# Patient Record
Sex: Male | Born: 1965 | Race: White | Hispanic: No | Marital: Married | Smoking: Never smoker
Health system: Southern US, Community
[De-identification: ages and names within clinical notes are randomized; demographics above are authoritative.]

## PROBLEM LIST (undated history)

## (undated) DIAGNOSIS — L409 Psoriasis, unspecified: Secondary | ICD-10-CM

## (undated) DIAGNOSIS — F329 Major depressive disorder, single episode, unspecified: Secondary | ICD-10-CM

## (undated) DIAGNOSIS — F419 Anxiety disorder, unspecified: Secondary | ICD-10-CM

## (undated) DIAGNOSIS — F32A Depression, unspecified: Secondary | ICD-10-CM

## (undated) HISTORY — PX: GASTRIC BYPASS: SHX52

## (undated) HISTORY — PX: KNEE SURGERY: SHX244

---
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
## 2005-05-10 ENCOUNTER — Emergency Department: Payer: Self-pay | Admitting: Emergency Medicine

## 2005-07-26 ENCOUNTER — Ambulatory Visit: Payer: Self-pay | Admitting: General Surgery

## 2006-07-05 ENCOUNTER — Emergency Department (HOSPITAL_COMMUNITY): Admission: EM | Admit: 2006-07-05 | Discharge: 2006-07-05 | Payer: Self-pay | Admitting: Emergency Medicine

## 2007-05-05 ENCOUNTER — Emergency Department: Payer: Self-pay | Admitting: Emergency Medicine

## 2007-05-06 ENCOUNTER — Other Ambulatory Visit: Payer: Self-pay

## 2008-08-09 ENCOUNTER — Emergency Department: Payer: Self-pay | Admitting: Emergency Medicine

## 2011-03-23 ENCOUNTER — Ambulatory Visit: Payer: Self-pay | Admitting: Dermatology

## 2011-04-13 ENCOUNTER — Ambulatory Visit: Payer: Self-pay | Admitting: Dermatology

## 2011-04-22 ENCOUNTER — Emergency Department: Payer: Self-pay | Admitting: Internal Medicine

## 2011-10-01 DIAGNOSIS — L409 Psoriasis, unspecified: Secondary | ICD-10-CM | POA: Insufficient documentation

## 2011-10-01 DIAGNOSIS — N401 Enlarged prostate with lower urinary tract symptoms: Secondary | ICD-10-CM | POA: Insufficient documentation

## 2014-06-07 DIAGNOSIS — E785 Hyperlipidemia, unspecified: Secondary | ICD-10-CM | POA: Insufficient documentation

## 2014-06-07 DIAGNOSIS — R079 Chest pain, unspecified: Secondary | ICD-10-CM | POA: Insufficient documentation

## 2014-06-07 DIAGNOSIS — R0602 Shortness of breath: Secondary | ICD-10-CM | POA: Insufficient documentation

## 2015-10-11 DIAGNOSIS — C4491 Basal cell carcinoma of skin, unspecified: Secondary | ICD-10-CM

## 2015-10-11 HISTORY — DX: Basal cell carcinoma of skin, unspecified: C44.91

## 2016-04-14 DIAGNOSIS — M6740 Ganglion, unspecified site: Secondary | ICD-10-CM | POA: Insufficient documentation

## 2016-04-14 DIAGNOSIS — M5416 Radiculopathy, lumbar region: Secondary | ICD-10-CM | POA: Insufficient documentation

## 2016-06-01 DIAGNOSIS — F40240 Claustrophobia: Secondary | ICD-10-CM | POA: Insufficient documentation

## 2016-06-14 DIAGNOSIS — E8881 Metabolic syndrome: Secondary | ICD-10-CM | POA: Insufficient documentation

## 2016-12-24 DIAGNOSIS — Z9884 Bariatric surgery status: Secondary | ICD-10-CM | POA: Insufficient documentation

## 2017-06-20 ENCOUNTER — Ambulatory Visit
Admission: EM | Admit: 2017-06-20 | Discharge: 2017-06-20 | Disposition: A | Discharge status: Home / Self Care | Payer: 59 | Attending: Family Medicine | Admitting: Family Medicine

## 2017-06-20 ENCOUNTER — Other Ambulatory Visit: Payer: Self-pay

## 2017-06-20 DIAGNOSIS — M542 Cervicalgia: Secondary | ICD-10-CM

## 2017-06-20 DIAGNOSIS — R51 Headache: Secondary | ICD-10-CM

## 2017-06-20 HISTORY — DX: Psoriasis, unspecified: L40.9

## 2017-06-20 MED ORDER — METAXALONE 800 MG PO TABS: 800.0000 mg | ORAL_TABLET | Freq: Three times a day (TID) | ORAL | 0 refills | Status: DC | PRN

## 2017-06-20 MED ORDER — KETOROLAC TROMETHAMINE 60 MG/2ML IM SOLN
60.0000 mg | Freq: Once | INTRAMUSCULAR | Status: AC
Start: 2017-06-20 — End: 2017-06-20
  Administered 2017-06-20: 60 mg via INTRAMUSCULAR

## 2017-06-20 NOTE — ED Provider Notes (Signed)
[facility]   CSN: [id number] Arrival date & time: 06/20/17  1101   History   Chief Complaint Chief Complaint  Patient presents with  . Neck Pain   HPI  52 year old male presents with neck pain.  Patient reports that he has had neck pain since Sunday.  He states that it is getting worse.  Pain is located predominantly in the posterior aspect of his neck.  He reports associated decreased range of motion in all planes but particularly in left rotation.  He has seen his chiropractor and had treatment without improvement.  He reports that he is having an ongoing severe headache as result of his neck pain.  He is currently wearing a neck brace.  He is tried ice and Biofreeze as well as his chiropractic treatments without improvement.  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  He is taken Tylenol without relief.  His pain is worse with range of motion.  No reports of radicular symptoms.  No other associated symptoms.  No other complaints.  Past Medical History:  Diagnosis Date  . Psoriasis    Past Surgical History:  Procedure Laterality Date  . GASTRIC BYPASS     Home Medications    Prior to Admission medications   Medication Sig Start Date End Date Taking? Authorizing Provider  Calcium Citrate 250 MG TABS Take by mouth.    [provider]  ferrous sulfate 325 (65 FE) MG tablet Take by mouth.    [provider]  metaxalone (SKELAXIN) 800 MG tablet Take 1 tablet (800 mg total) by mouth 3 (three) times daily as needed for muscle spasms. 06/20/17   Coral Spikes, DO  Multiple Vitamin (MULTI-VITAMINS) TABS Take by mouth.    [provider]  STELARA 90 MG/ML SOSY injection INJECT 1 SYRINGE UNDER THE SKIN EVERY 12 WEEKS 06/13/17   [provider]   Family History Family History  Problem Relation Age of Onset  . Cancer Mother   . Dementia Father    Social History Social History   Tobacco Use  . Smoking status: Never Smoker  . Smokeless  tobacco: Never Used  Substance Use Topics  . Alcohol use: Yes    Comment: rare  . Drug use: Never   Allergies   Patient has no known allergies.  Review of Systems Review of Systems  Constitutional: Negative.   Musculoskeletal: Positive for neck pain.  Neurological: Positive for headaches.    Physical Exam Triage Vital Signs ED Triage Vitals  Enc Vitals Group     BP 06/20/17 1113 116/81     Pulse Rate 06/20/17 1113 (!) 48     Resp 06/20/17 1113 18     Temp 06/20/17 1113 98.5 F (36.9 C)     Temp Source 06/20/17 1113 Oral     SpO2 06/20/17 1113 98 %     Weight 06/20/17 1114 218 lb (98.9 kg)     Height 06/20/17 1114 6\' 1"  (1.854 m)     Head Circumference --      Peak Flow --      Pain Score 06/20/17 1114 10     Pain Loc --      Pain Edu? --      Excl. in [place]? --    Updated Vital Signs BP 116/81 (BP Location: Right Arm)   Pulse (!) 48 Comment: "normally low heartrate"  Temp 98.5 F (36.9 C) (Oral)   Resp 18   Ht 6\' 1"  (  1.854 m)   Wt 218 lb (98.9 kg)   SpO2 98%   BMI 28.76 kg/m   Physical Exam  Constitutional: He is oriented to person, place, and time. He appears well-developed. No distress.  Neck:  Decreased range of motion in all planes secondary to pain. Tenderness posteriorly.  Cardiovascular: Normal rate and regular rhythm.  Pulmonary/Chest: Effort normal and breath sounds normal.  Neurological: He is alert and oriented to person, place, and time.  Psychiatric: He has a normal mood and affect. His behavior is normal.  Nursing note and vitals reviewed.  UC Treatments / Results  Labs (all labs ordered are listed, but only abnormal results are displayed) Labs Reviewed - No data to display  EKG None Radiology No results found.  Procedures Procedures (including critical care time)  Medications Ordered in UC Medications  ketorolac (TORADOL) injection 60 mg (60 mg Intramuscular Given 06/20/17 1138)     Initial Impression / Assessment and Plan / UC  Course  I have reviewed the triage vital signs and the nursing notes.  Pertinent labs & imaging results that were available during my care of the patient were reviewed by me and considered in my medical decision making (see chart for details).     52 year old male presents with neck pain.  This is musculoskeletal in origin.  Toradol given here.  Treating with Skelaxin.  Final Clinical Impressions(s) / UC Diagnoses   Final diagnoses:  Neck pain    ED Discharge Orders        Ordered    metaxalone (SKELAXIN) 800 MG tablet  3 times daily PRN     06/20/17 1138     Controlled Substance Prescriptions [place] Controlled Substance Registry consulted? Not Applicable   Coral Spikes, DO 06/20/17 1217

## 2017-06-20 NOTE — Discharge Instructions (Signed)
Heat, hot bath/shower.  Muscle relaxant as prescribed.  Take care  Dr. Lacinda Axon

## 2017-06-20 NOTE — ED Triage Notes (Addendum)
Pt reports he awoke from a nap in his chair on Sunday with neck pain which has gotten worse since yesterday. Seen by Chiropractor but no improvement. Pain 10/10 and causing a bad headache

## 2018-02-12 DIAGNOSIS — C4492 Squamous cell carcinoma of skin, unspecified: Secondary | ICD-10-CM

## 2018-02-12 HISTORY — DX: Squamous cell carcinoma of skin, unspecified: C44.92

## 2018-05-28 DIAGNOSIS — R001 Bradycardia, unspecified: Secondary | ICD-10-CM | POA: Insufficient documentation

## 2018-12-15 ENCOUNTER — Other Ambulatory Visit: Payer: Self-pay

## 2018-12-15 DIAGNOSIS — Z20822 Contact with and (suspected) exposure to covid-19: Secondary | ICD-10-CM

## 2018-12-17 LAB — NOVEL CORONAVIRUS, NAA: SARS-CoV-2, NAA: NOT DETECTED

## 2019-02-27 ENCOUNTER — Encounter: Payer: Self-pay | Admitting: Emergency Medicine

## 2019-02-27 ENCOUNTER — Other Ambulatory Visit: Payer: Self-pay

## 2019-02-27 ENCOUNTER — Ambulatory Visit
Admission: EM | Admit: 2019-02-27 | Discharge: 2019-02-27 | Disposition: A | Discharge status: Home / Self Care | Payer: Managed Care, Other (non HMO) | Attending: Family Medicine | Admitting: Family Medicine

## 2019-02-27 DIAGNOSIS — R197 Diarrhea, unspecified: Secondary | ICD-10-CM

## 2019-02-27 DIAGNOSIS — R509 Fever, unspecified: Secondary | ICD-10-CM

## 2019-02-27 DIAGNOSIS — B349 Viral infection, unspecified: Secondary | ICD-10-CM | POA: Diagnosis not present

## 2019-02-27 DIAGNOSIS — R05 Cough: Secondary | ICD-10-CM | POA: Diagnosis not present

## 2019-02-27 HISTORY — DX: Anxiety disorder, unspecified: F41.9

## 2019-02-27 HISTORY — DX: Depression, unspecified: F32.A

## 2019-02-27 LAB — RAPID INFLUENZA A&B ANTIGENS
Influenza A (ARMC): NEGATIVE
Influenza B (ARMC): NEGATIVE

## 2019-02-27 NOTE — ED Triage Notes (Signed)
Patient in today c/o cough, diarrhea, fever (99-101), body aches, dizzy, runny nose, headache and scratchy throat. Patient tested positive for Covid end of Aug or first of September. Patient then had a negative covid test 6 weeks later from his positive test.

## 2019-02-27 NOTE — ED Provider Notes (Signed)
[facility]    CSN: [id number] Arrival date & time: 02/27/19  [id number]      History   Chief Complaint Chief Complaint  Patient presents with  . Cough  . Diarrhea  . Fever    HPI AUBRA MCKEONE is a 53 y.o. male.   53 yo male with a c/o body aches, cough, fevers, headache, scratchy throat, diarrhea. Denies any chest pains or shortness of breath.    Cough Associated symptoms: fever   Diarrhea Associated symptoms: fever   Fever Associated symptoms: cough and diarrhea     Past Medical History:  Diagnosis Date  . Anxiety   . Depression   . Psoriasis     There are no problems to display for this patient.   Past Surgical History:  Procedure Laterality Date  . GASTRIC BYPASS         Home Medications    Prior to Admission medications   Medication Sig Start Date End Date Taking? Authorizing Provider  busPIRone (BUSPAR) 7.5 MG tablet Take by mouth. 02/13/19 02/13/20 Yes [provider]  citalopram (CELEXA) 20 MG tablet Take by mouth. 02/13/19  Yes [provider]  Multiple Vitamin (MULTI-VITAMINS) TABS Take by mouth.   Yes [provider]  pantoprazole (PROTONIX) 40 MG tablet Take by mouth. 02/13/19  Yes [provider]  STELARA 90 MG/ML SOSY injection INJECT 1 SYRINGE UNDER THE SKIN EVERY 12 WEEKS 06/13/17  Yes [provider]  Calcium Citrate 250 MG TABS Take by mouth.    [provider]  ferrous sulfate 325 (65 FE) MG tablet Take by mouth.    [provider]  metaxalone (SKELAXIN) 800 MG tablet Take 1 tablet (800 mg total) by mouth 3 (three) times daily as needed for muscle spasms. 06/20/17   Coral Spikes, DO    Family History Family History  Problem Relation Age of Onset  . Cancer Mother   . Dementia Father     Social History Social History   Tobacco Use  . Smoking status: Never Smoker  . Smokeless tobacco: Never Used  Substance Use Topics  . Alcohol use: Yes    Comment: rare  .  Drug use: Never     Allergies   Patient has no known allergies.   Review of Systems Review of Systems  Constitutional: Positive for fever.  Respiratory: Positive for cough.   Gastrointestinal: Positive for diarrhea.     Physical Exam Triage Vital Signs ED Triage Vitals  Enc Vitals Group     BP 02/27/19 0906 119/69     Pulse Rate 02/27/19 0906 63     Resp 02/27/19 0906 18     Temp 02/27/19 0906 99.2 F (37.3 C)     Temp Source 02/27/19 0906 Oral     SpO2 02/27/19 0906 100 %     Weight 02/27/19 0906 228 lb (103.4 kg)     Height 02/27/19 0906 6\' 1"  (1.854 m)     Head Circumference --      Peak Flow --      Pain Score 02/27/19 0905 6     Pain Loc --      Pain Edu? --      Excl. in [place]? --    No data found.  Updated Vital Signs BP 119/69 (BP Location: Left Arm)   Pulse 63   Temp 99.2 F (37.3 C) (Oral)   Resp 18   Ht 6\' 1"  (1.854 m)   Wt 103.4  kg   SpO2 100%   BMI 30.08 kg/m   Visual Acuity Right Eye Distance:   Left Eye Distance:   Bilateral Distance:    Right Eye Near:   Left Eye Near:    Bilateral Near:     Physical Exam Vitals and nursing note reviewed.  Constitutional:      General: He is not in acute distress.    Appearance: He is not toxic-appearing or diaphoretic.  Cardiovascular:     Rate and Rhythm: Normal rate.  Pulmonary:     Effort: Pulmonary effort is normal. No respiratory distress.     Breath sounds: Normal breath sounds. No stridor. No wheezing, rhonchi or rales.  Neurological:     Mental Status: He is alert.      UC Treatments / Results  Labs (all labs ordered are listed, but only abnormal results are displayed) Labs Reviewed  RAPID INFLUENZA A&B ANTIGENS ([facility] ONLY)  NOVEL CORONAVIRUS, NAA (HOSP ORDER, SEND-OUT TO REF LAB; TAT 18-24 HRS)    EKG   Radiology No results found.  Procedures Procedures (including critical care time)  Medications Ordered in UC Medications - No data to display  Initial Impression /  Assessment and Plan / UC Course  I have reviewed the triage vital signs and the nursing notes.  Pertinent labs & imaging results that were available during my care of the patient were reviewed by me and considered in my medical decision making (see chart for details).      Final Clinical Impressions(s) / UC Diagnoses   Final diagnoses:  Viral syndrome    ED Prescriptions    None     1. Lab results and diagnosis reviewed with patient 2.. Recommend supportive treatment with rest, fluids, otc medications prn 3. covid test done 4. Follow-up prn if symptoms worsen or don't improve  PDMP not reviewed this encounter.   Norval Gable, MD 02/27/19 [phone]

## 2019-03-01 ENCOUNTER — Telehealth: Payer: Self-pay | Admitting: Emergency Medicine

## 2019-03-01 NOTE — Telephone Encounter (Signed)
Patient called to get covid results. Advised covid is positive. Patient advised to quarantine for 10 days from start of symptoms and 3 days fever free without fever reducer and no new respiratory symptoms. Patient agreed and voiced understanding.

## 2019-03-02 LAB — NOVEL CORONAVIRUS, NAA (HOSP ORDER, SEND-OUT TO REF LAB; TAT 18-24 HRS): SARS-CoV-2, NAA: DETECTED — AB

## 2019-05-29 ENCOUNTER — Ambulatory Visit: Payer: Managed Care, Other (non HMO)

## 2019-06-25 ENCOUNTER — Other Ambulatory Visit: Payer: Self-pay

## 2019-06-25 ENCOUNTER — Ambulatory Visit: Payer: Self-pay | Attending: Internal Medicine

## 2019-06-25 DIAGNOSIS — Z23 Encounter for immunization: Secondary | ICD-10-CM

## 2019-06-25 NOTE — Progress Notes (Signed)
   Covid-19 Vaccination Clinic  Name:  Nicholas Cherry    MRN: [id number] DOB: 03-02-1966  06/25/2019  [name] was observed post Covid-19 immunization for 15 minutes without incident. He was provided with Vaccine Information Sheet and instruction to access the V-Safe system.   [name] was instructed to call 911 with any severe reactions post vaccine: Marland Kitchen Difficulty breathing  . Swelling of face and throat  . A fast heartbeat  . A bad rash all over body  . Dizziness and weakness   Immunizations Administered    Name Date Dose VIS Date Route   Pfizer COVID-19 Vaccine 06/25/2019  9:52 AM 0.3 mL 02/20/2019 Intramuscular   Manufacturer: [place]   Lot: [id number]   [place]: [id number]

## 2019-07-13 ENCOUNTER — Ambulatory Visit: Payer: Managed Care, Other (non HMO) | Admitting: Dermatology

## 2019-07-21 ENCOUNTER — Ambulatory Visit: Payer: Self-pay | Attending: Internal Medicine

## 2019-07-21 DIAGNOSIS — Z23 Encounter for immunization: Secondary | ICD-10-CM

## 2019-07-21 NOTE — Progress Notes (Signed)
   Covid-19 Vaccination Clinic  Name:  Nicholas Cherry    MRN: [id number] DOB: 03-22-1965  07/21/2019  [name] was observed post Covid-19 immunization for 15 minutes without incident. He was provided with Vaccine Information Sheet and instruction to access the V-Safe system.   [name] was instructed to call 911 with any severe reactions post vaccine: Marland Kitchen Difficulty breathing  . Swelling of face and throat  . A fast heartbeat  . A bad rash all over body  . Dizziness and weakness   Immunizations Administered    Name Date Dose VIS Date Route   Pfizer COVID-19 Vaccine 07/21/2019  9:21 AM 0.3 mL 05/06/2018 Intramuscular   Manufacturer: [place]   Lot: [id number]   [place]: [id number]

## 2019-09-11 ENCOUNTER — Ambulatory Visit (INDEPENDENT_AMBULATORY_CARE_PROVIDER_SITE_OTHER): Payer: PRIVATE HEALTH INSURANCE

## 2019-09-11 ENCOUNTER — Ambulatory Visit
Admission: EM | Admit: 2019-09-11 | Discharge: 2019-09-11 | Disposition: A | Discharge status: Home / Self Care | Payer: PRIVATE HEALTH INSURANCE | Attending: Family Medicine | Admitting: Family Medicine

## 2019-09-11 DIAGNOSIS — M79674 Pain in right toe(s): Secondary | ICD-10-CM | POA: Diagnosis not present

## 2019-09-11 DIAGNOSIS — S50319A Abrasion of unspecified elbow, initial encounter: Secondary | ICD-10-CM | POA: Diagnosis not present

## 2019-09-11 DIAGNOSIS — M25532 Pain in left wrist: Secondary | ICD-10-CM | POA: Diagnosis not present

## 2019-09-11 DIAGNOSIS — M25531 Pain in right wrist: Secondary | ICD-10-CM | POA: Diagnosis not present

## 2019-09-11 NOTE — Discharge Instructions (Addendum)
Take ibuprofen as needed.  Rest and elevate your hand.  Apply ice packs 2-3 times a day for up to 20 minutes each.  Wear a brace as needed for comfort  Xray was negative for any fractures or misalignments  Follow up with your primary care provider or an orthopedist if you symptoms continue or worsen;  Or if you develop new symptoms, such as numbness, tingling, or weakness.
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
## 2019-09-12 ENCOUNTER — Other Ambulatory Visit: Payer: Self-pay

## 2019-09-12 ENCOUNTER — Ambulatory Visit
Admit: 2019-09-12 | Discharge: 2019-09-12 | Disposition: A | Discharge status: Home / Self Care | Payer: PRIVATE HEALTH INSURANCE | Attending: Emergency Medicine | Admitting: Emergency Medicine

## 2019-09-12 ENCOUNTER — Ambulatory Visit
Admission: EM | Admit: 2019-09-12 | Discharge: 2019-09-12 | Disposition: A | Discharge status: Home / Self Care | Payer: PRIVATE HEALTH INSURANCE | Attending: Emergency Medicine | Admitting: Emergency Medicine

## 2019-09-12 DIAGNOSIS — N5089 Other specified disorders of the male genital organs: Secondary | ICD-10-CM

## 2019-09-12 DIAGNOSIS — S3994XA Unspecified injury of external genitals, initial encounter: Secondary | ICD-10-CM

## 2019-09-12 LAB — URINALYSIS, COMPLETE (UACMP) WITH MICROSCOPIC
Bilirubin Urine: NEGATIVE
Glucose, UA: NEGATIVE mg/dL
Hgb urine dipstick: NEGATIVE
Ketones, ur: NEGATIVE mg/dL
Leukocytes,Ua: NEGATIVE
Nitrite: NEGATIVE
Protein, ur: NEGATIVE mg/dL
RBC / HPF: NONE SEEN RBC/hpf (ref 0–5)
Specific Gravity, Urine: 1.025 (ref 1.005–1.030)
pH: 5.5 (ref 5.0–8.0)

## 2019-09-12 NOTE — Discharge Instructions (Addendum)
You were seen for testicle pain and are being treated for the same.   We completed a urine analysis which showed no blood in your urine.  A scrotal ultrasound was ordered.  It is to be completed at [facility]  We will give you a call with further directions afterwards. --- Take care, Dr. Marland Kitchen, NP-c
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
## 2020-01-25 ENCOUNTER — Other Ambulatory Visit: Payer: Self-pay

## 2020-01-25 ENCOUNTER — Encounter: Payer: Self-pay | Admitting: Dermatology

## 2020-01-25 ENCOUNTER — Ambulatory Visit: Payer: PRIVATE HEALTH INSURANCE | Admitting: Dermatology

## 2020-01-25 DIAGNOSIS — D492 Neoplasm of unspecified behavior of bone, soft tissue, and skin: Secondary | ICD-10-CM

## 2020-01-25 DIAGNOSIS — D2371 Other benign neoplasm of skin of right lower limb, including hip: Secondary | ICD-10-CM | POA: Diagnosis not present

## 2020-01-25 DIAGNOSIS — D2372 Other benign neoplasm of skin of left lower limb, including hip: Secondary | ICD-10-CM | POA: Diagnosis not present

## 2020-01-25 DIAGNOSIS — L820 Inflamed seborrheic keratosis: Secondary | ICD-10-CM

## 2020-01-25 DIAGNOSIS — C44320 Squamous cell carcinoma of skin of unspecified parts of face: Secondary | ICD-10-CM

## 2020-01-25 DIAGNOSIS — L578 Other skin changes due to chronic exposure to nonionizing radiation: Secondary | ICD-10-CM | POA: Diagnosis not present

## 2020-01-25 DIAGNOSIS — Z86007 Personal history of in-situ neoplasm of skin: Secondary | ICD-10-CM

## 2020-01-25 DIAGNOSIS — C44329 Squamous cell carcinoma of skin of other parts of face: Secondary | ICD-10-CM | POA: Diagnosis not present

## 2020-01-25 DIAGNOSIS — D239 Other benign neoplasm of skin, unspecified: Secondary | ICD-10-CM

## 2020-01-25 DIAGNOSIS — L409 Psoriasis, unspecified: Secondary | ICD-10-CM

## 2020-01-25 DIAGNOSIS — D225 Melanocytic nevi of trunk: Secondary | ICD-10-CM | POA: Diagnosis not present

## 2020-01-25 HISTORY — DX: Other benign neoplasm of skin, unspecified: D23.9
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
## 2020-01-25 NOTE — Patient Instructions (Signed)

## 2020-01-27 ENCOUNTER — Encounter: Payer: Self-pay | Admitting: Dermatology

## 2020-01-28 ENCOUNTER — Telehealth: Payer: Self-pay

## 2020-01-28 NOTE — Telephone Encounter (Signed)
Patient informed of pathology results 
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
## 2020-05-05 DIAGNOSIS — F1010 Alcohol abuse, uncomplicated: Secondary | ICD-10-CM | POA: Insufficient documentation

## 2020-05-05 DIAGNOSIS — F419 Anxiety disorder, unspecified: Secondary | ICD-10-CM | POA: Insufficient documentation

## 2020-05-09 ENCOUNTER — Telehealth: Payer: Self-pay

## 2020-05-09 DIAGNOSIS — L400 Psoriasis vulgaris: Secondary | ICD-10-CM

## 2020-05-09 NOTE — Telephone Encounter (Signed)
Patient advised of information per Dr. Kowalski.  

## 2020-05-09 NOTE — Telephone Encounter (Signed)
Patient called about a RF on his Stelara medication. Patient last here in November for 6 month follow up and at that time patient not using due to controlled psoriasis. Patient is currently experiencing a flare and would like a RF. Okay to send in?

## 2020-05-09 NOTE — Telephone Encounter (Signed)
We would need to get lab - please order: CBC Chemistries Quantiferon gold   Once those done, if they are OK, then can order Stelara - would need to start with loading dosing.  Keep May 2022 appt.

## 2020-05-18 LAB — BASIC METABOLIC PANEL
BUN/Creatinine Ratio: 15 (ref 9–20)
BUN: 12 mg/dL (ref 6–24)
CO2: 22 mmol/L (ref 20–29)
Calcium: 9.2 mg/dL (ref 8.7–10.2)
Chloride: 103 mmol/L (ref 96–106)
Creatinine, Ser: 0.82 mg/dL (ref 0.76–1.27)
Glucose: 95 mg/dL (ref 65–99)
Potassium: 4.5 mmol/L (ref 3.5–5.2)
Sodium: 140 mmol/L (ref 134–144)
eGFR: 103 mL/min/{1.73_m2} (ref >59)

## 2020-05-18 LAB — CBC WITH DIFFERENTIAL/PLATELET
Basophils Absolute: 0.1 10*3/uL (ref 0.0–0.2)
Basos: 1 %
EOS (ABSOLUTE): 0.2 10*3/uL (ref 0.0–0.4)
Eos: 4 %
Hematocrit: 41.8 % (ref 37.5–51.0)
Hemoglobin: 14.4 g/dL (ref 13.0–17.7)
Immature Grans (Abs): 0 10*3/uL (ref 0.0–0.1)
Immature Granulocytes: 1 %
Lymphocytes Absolute: 1.1 10*3/uL (ref 0.7–3.1)
Lymphs: 21 %
MCH: 29.6 pg (ref 26.6–33.0)
MCHC: 34.4 g/dL (ref 31.5–35.7)
MCV: 86 fL (ref 79–97)
Monocytes Absolute: 0.4 10*3/uL (ref 0.1–0.9)
Monocytes: 7 %
Neutrophils Absolute: 3.5 10*3/uL (ref 1.4–7.0)
Neutrophils: 66 %
Platelets: 208 10*3/uL (ref 150–450)
RBC: 4.87 x10E6/uL (ref 4.14–5.80)
RDW: 11.8 % (ref 11.6–15.4)
WBC: 5.2 10*3/uL (ref 3.4–10.8)

## 2020-05-18 LAB — QUANTIFERON-TB GOLD PLUS
QuantiFERON Mitogen Value: >10 IU/mL
QuantiFERON Nil Value: 0.01 IU/mL
QuantiFERON TB1 Ag Value: 0.01 IU/mL
QuantiFERON TB2 Ag Value: 0.01 IU/mL
QuantiFERON-TB Gold Plus: NEGATIVE

## 2020-05-23 ENCOUNTER — Telehealth: Payer: Self-pay

## 2020-05-23 DIAGNOSIS — L409 Psoriasis, unspecified: Secondary | ICD-10-CM

## 2020-05-23 MED ORDER — STELARA 90 MG/ML ~~LOC~~ SOSY: 90.0000 mg | PREFILLED_SYRINGE | SUBCUTANEOUS | 0 refills | Status: DC

## 2020-05-23 MED ORDER — STELARA 90 MG/ML ~~LOC~~ SOSY: 90.0000 mg | PREFILLED_SYRINGE | SUBCUTANEOUS | 1 refills | Status: DC

## 2020-05-23 NOTE — Telephone Encounter (Signed)
-----   Message from Ralene Bathe, MD sent at 05/18/2020 12:14 PM EST ----- Lab OK: CBC; Chemistry normal Quantiferon Gold / TB test - negative / normal  May send in [place] with starting/loading dosing. Keep 08/01/20 appt.

## 2020-05-23 NOTE — Telephone Encounter (Signed)
Advised patient of lab results and sent in Stelara to [facility].

## 2020-05-30 DIAGNOSIS — J984 Other disorders of lung: Secondary | ICD-10-CM | POA: Insufficient documentation

## 2020-07-11 ENCOUNTER — Other Ambulatory Visit: Payer: Self-pay

## 2020-07-11 DIAGNOSIS — L409 Psoriasis, unspecified: Secondary | ICD-10-CM

## 2020-07-11 MED ORDER — STELARA 90 MG/ML ~~LOC~~ SOSY: 90.0000 mg | PREFILLED_SYRINGE | SUBCUTANEOUS | 0 refills | Status: DC

## 2020-07-11 MED ORDER — STELARA 90 MG/ML ~~LOC~~ SOSY: 90.0000 mg | PREFILLED_SYRINGE | SUBCUTANEOUS | 1 refills | Status: DC

## 2020-07-11 NOTE — Progress Notes (Signed)
Per Iantha Fallen, rx needs to be sent to [facility]

## 2020-08-01 ENCOUNTER — Ambulatory Visit: Payer: PRIVATE HEALTH INSURANCE | Admitting: Dermatology

## 2020-11-21 ENCOUNTER — Other Ambulatory Visit: Payer: Self-pay

## 2020-11-21 ENCOUNTER — Ambulatory Visit: Payer: No Typology Code available for payment source | Admitting: Dermatology

## 2020-11-21 DIAGNOSIS — Z85828 Personal history of other malignant neoplasm of skin: Secondary | ICD-10-CM

## 2020-11-21 DIAGNOSIS — Z86018 Personal history of other benign neoplasm: Secondary | ICD-10-CM | POA: Diagnosis not present

## 2020-11-21 DIAGNOSIS — Z1283 Encounter for screening for malignant neoplasm of skin: Secondary | ICD-10-CM | POA: Diagnosis not present

## 2020-11-21 DIAGNOSIS — L814 Other melanin hyperpigmentation: Secondary | ICD-10-CM

## 2020-11-21 DIAGNOSIS — L821 Other seborrheic keratosis: Secondary | ICD-10-CM

## 2020-11-21 DIAGNOSIS — L820 Inflamed seborrheic keratosis: Secondary | ICD-10-CM

## 2020-11-21 DIAGNOSIS — D229 Melanocytic nevi, unspecified: Secondary | ICD-10-CM

## 2020-11-21 DIAGNOSIS — L578 Other skin changes due to chronic exposure to nonionizing radiation: Secondary | ICD-10-CM

## 2020-11-21 DIAGNOSIS — L409 Psoriasis, unspecified: Secondary | ICD-10-CM

## 2020-11-21 DIAGNOSIS — D1800 Hemangioma unspecified site: Secondary | ICD-10-CM

## 2020-11-21 NOTE — Progress Notes (Signed)
Follow-Up Visit   Subjective  Nicholas Cherry is a 55 y.o. male who presents for the following: Psoriasis (Patient here today for psoriasis follow up at hands. Patient currently on Stelara and psoriasis is well controlled. Patient does have a spot at neck that is itchy. ). The patient presents for Upper Body Skin Exam (UBSE) for skin cancer screening and mole check.  The following portions of the chart were reviewed this encounter and updated as appropriate:   Tobacco  Allergies  Meds  Problems  Med Hx  Surg Hx  Fam Hx     Review of Systems:  No other skin or systemic complaints except as noted in HPI or Assessment and Plan.  Objective  Well appearing patient in no apparent distress; mood and affect are within normal limits.  A focused examination was performed including legs, all skin waist up. Relevant physical exam findings are noted in the Assessment and Plan.  bilateral hands Clear   left lateral neck, right lateral neck (2) Erythematous keratotic or waxy stuck-on papule or plaque.    Assessment & Plan  Psoriasis bilateral hands  Psoriasis is a chronic non-curable, but treatable genetic/hereditary disease that may have other systemic features affecting other organ systems such as joints (Psoriatic Arthritis). It is associated with an increased risk of inflammatory bowel disease, heart disease, non-alcoholic fatty liver disease, and depression  Continue Stelara '90mg'$ /mL every 12 weeks BSA 0% on treatment, well controlled on Stelara No joint pain or arthritis Labs due at follow up Patient does have chest pain prior to eating, under evaluation and has had gallbladder removed. Not believed to be related to Stelara. Coincidentally symptoms began just after Covid vaccines.   Related Medications ustekinumab (STELARA) 90 MG/ML SOSY injection Inject 1 mL (90 mg total) into the skin as directed. At weeks 0 & 4.  Inflamed seborrheic keratosis left lateral neck, right  lateral neck  Destruction of lesion - left lateral neck, right lateral neck Complexity: simple   Destruction method: cryotherapy   Informed consent: discussed and consent obtained   Timeout:  patient name, date of birth, surgical site, and procedure verified Lesion destroyed using liquid nitrogen: Yes   Region frozen until ice ball extended beyond lesion: Yes   Outcome: patient tolerated procedure well with no complications   Post-procedure details: wound care instructions given    Skin cancer screening  History of Basal Cell Carcinoma of the Skin - No evidence of recurrence today - Recommend regular full body skin exams - Recommend daily broad spectrum sunscreen SPF 30+ to sun-exposed areas, reapply every 2 hours as needed.  - Call if any new or changing lesions are noted between office visits   History of Dysplastic Nevi - No evidence of recurrence today - Recommend regular full body skin exams - Recommend daily broad spectrum sunscreen SPF 30+ to sun-exposed areas, reapply every 2 hours as needed.  - Call if any new or changing lesions are noted between office visits  History of Squamous Cell Carcinoma of the Skin - No evidence of recurrence today - No lymphadenopathy - Recommend regular full body skin exams - Recommend daily broad spectrum sunscreen SPF 30+ to sun-exposed areas, reapply every 2 hours as needed.  - Call if any new or changing lesions are noted between office visits  Lentigines - Scattered tan macules - Due to sun exposure - Benign-appering, observe - Recommend daily broad spectrum sunscreen SPF 30+ to sun-exposed areas, reapply every 2 hours as needed. -  Call for any changes  Seborrheic Keratoses - Stuck-on, waxy, tan-brown papules and/or plaques  - Benign-appearing - Discussed benign etiology and prognosis. - Observe - Call for any changes  Melanocytic Nevi - Tan-brown and/or pink-flesh-colored symmetric macules and papules - Benign appearing on  exam today - Observation - Call clinic for new or changing moles - Recommend daily use of broad spectrum spf 30+ sunscreen to sun-exposed areas.   Hemangiomas - Red papules - Discussed benign nature - Observe - Call for any changes  Actinic Damage - Chronic condition, secondary to cumulative UV/sun exposure - diffuse scaly erythematous macules with underlying dyspigmentation - Recommend daily broad spectrum sunscreen SPF 30+ to sun-exposed areas, reapply every 2 hours as needed.  - Staying in the shade or wearing long sleeves, sun glasses (UVA+UVB protection) and wide brim hats (4-inch brim around the entire circumference of the hat) are also recommended for sun protection.  - Call for new or changing lesions.  Skin cancer screening performed today.  Return in about 6 months (around 05/21/2021) for Psoriasis.  Graciella Belton, RMA, am acting as scribe for Sarina Ser, MD . Documentation: I have reviewed the above documentation for accuracy and completeness, and I agree with the above.  Sarina Ser, MD

## 2020-11-21 NOTE — Patient Instructions (Signed)
Cryotherapy Aftercare  Wash gently with soap and water everyday.   Apply Vaseline and Band-Aid daily until healed.   Reviewed risks of biologics including immunosuppression, infections, injection site reaction, and failure to improve condition. Goal is control of skin condition, not cure.  Some older biologics such as Humira and Enbrel may slightly increase risk of malignancy and may worsen congestive heart failure. The use of biologics requires long term medication management, including periodic office visits and monitoring of blood work.  Recommend daily broad spectrum sunscreen SPF 30+ to sun-exposed areas, reapply every 2 hours as needed. Call for new or changing lesions.  Staying in the shade or wearing long sleeves, sun glasses (UVA+UVB protection) and wide brim hats (4-inch brim around the entire circumference of the hat) are also recommended for sun protection.   If you have any questions or concerns for your doctor, please call our main line at [phone] and press option 4 to reach your doctor's medical assistant. If no one answers, please leave a voicemail as directed and we will return your call as soon as possible. Messages left after 4 pm will be answered the following business day.   You may also send [place] a message via [place]. We typically respond to MyChart messages within 1-2 business days.  For prescription refills, please ask your pharmacy to contact our office. Our fax number is [phone].  If you have an urgent issue when the clinic is closed that cannot wait until the next business day, you can page your doctor at the number below.    Please note that while we do our best to be available for urgent issues outside of office hours, we are not available 24/7.   If you have an urgent issue and are unable to reach [place], you may choose to seek medical care at your doctor's office, retail clinic, urgent care center, or emergency room.  If you have a medical emergency, please  immediately call 911 or go to the emergency department.  Pager Numbers  - Dr. Nehemiah Massed: [phone]  - Dr. Laurence Ferrari: [phone]  - Dr. Nicole Kindred: [phone]  In the event of inclement weather, please call our main line at [phone] for an update on the status of any delays or closures.  Dermatology Medication Tips: Please keep the boxes that topical medications come in in order to help keep track of the instructions about where and how to use these. Pharmacies typically print the medication instructions only on the boxes and not directly on the medication tubes.   If your medication is too expensive, please contact our office at [phone] option 4 or send [place] a message through [place].   We are unable to tell what your co-pay for medications will be in advance as this is different depending on your insurance coverage. However, we may be able to find a substitute medication at lower cost or fill out paperwork to get insurance to cover a needed medication.   If a prior authorization is required to get your medication covered by your insurance company, please allow [place] 1-2 business days to complete this process.  Drug prices often vary depending on where the prescription is filled and some pharmacies may offer cheaper prices.  The website www.goodrx.com contains coupons for medications through different pharmacies. The prices here do not account for what the cost may be with help from insurance (it may be cheaper with your insurance), but the website can give you the price if you did not use any insurance.  -  You can print the associated coupon and take it with your prescription to the pharmacy.  - You may also stop by our office during regular business hours and pick up a GoodRx coupon card.  - If you need your prescription sent electronically to a different pharmacy, notify our office through [facility] or by phone at [phone] option 4.

## 2020-11-25 ENCOUNTER — Encounter: Payer: Self-pay | Admitting: Dermatology

## 2020-11-30 DIAGNOSIS — K219 Gastro-esophageal reflux disease without esophagitis: Secondary | ICD-10-CM | POA: Insufficient documentation

## 2020-11-30 DIAGNOSIS — K224 Dyskinesia of esophagus: Secondary | ICD-10-CM | POA: Insufficient documentation
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
## 2021-05-24 ENCOUNTER — Other Ambulatory Visit: Payer: Self-pay

## 2021-05-24 ENCOUNTER — Other Ambulatory Visit: Payer: Self-pay | Admitting: Dermatology

## 2021-05-24 ENCOUNTER — Ambulatory Visit (INDEPENDENT_AMBULATORY_CARE_PROVIDER_SITE_OTHER): Payer: PRIVATE HEALTH INSURANCE | Admitting: Dermatology

## 2021-05-24 DIAGNOSIS — D229 Melanocytic nevi, unspecified: Secondary | ICD-10-CM

## 2021-05-24 DIAGNOSIS — L814 Other melanin hyperpigmentation: Secondary | ICD-10-CM

## 2021-05-24 DIAGNOSIS — L400 Psoriasis vulgaris: Secondary | ICD-10-CM

## 2021-05-24 DIAGNOSIS — D1800 Hemangioma unspecified site: Secondary | ICD-10-CM

## 2021-05-24 DIAGNOSIS — L821 Other seborrheic keratosis: Secondary | ICD-10-CM

## 2021-05-24 DIAGNOSIS — L570 Actinic keratosis: Secondary | ICD-10-CM

## 2021-05-24 DIAGNOSIS — Z85828 Personal history of other malignant neoplasm of skin: Secondary | ICD-10-CM | POA: Diagnosis not present

## 2021-05-24 DIAGNOSIS — L578 Other skin changes due to chronic exposure to nonionizing radiation: Secondary | ICD-10-CM

## 2021-05-24 DIAGNOSIS — D225 Melanocytic nevi of trunk: Secondary | ICD-10-CM

## 2021-05-24 DIAGNOSIS — Z86018 Personal history of other benign neoplasm: Secondary | ICD-10-CM

## 2021-05-24 DIAGNOSIS — Z1283 Encounter for screening for malignant neoplasm of skin: Secondary | ICD-10-CM

## 2021-05-24 DIAGNOSIS — D485 Neoplasm of uncertain behavior of skin: Secondary | ICD-10-CM | POA: Diagnosis not present

## 2021-05-24 MED ORDER — MOMETASONE FUROATE 0.1 % EX CREA: TOPICAL_CREAM | CUTANEOUS | 3 refills | Status: DC
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
## 2021-05-24 NOTE — Patient Instructions (Addendum)

## 2021-05-29 ENCOUNTER — Encounter: Payer: Self-pay | Admitting: Dermatology

## 2021-05-30 ENCOUNTER — Telehealth: Payer: Self-pay

## 2021-05-30 NOTE — Telephone Encounter (Signed)
Advised patient of results and scheduled appointment for excision/hd ? ?

## 2021-05-30 NOTE — Telephone Encounter (Signed)
-----   Message from Ralene Bathe, MD sent at 05/29/2021  6:30 PM EDT ----- ?Diagnosis ?1. Skin , left lat infraclavicular ?ACTINIC KERATOSIS, EXCORIATED ?2. Skin , left infrascapular ?DYSPLASTIC COMPOUND NEVUS WITH SEVERE ATYPIA, PERIPHERAL MARGIN INVOLVED, SEE DESCRIPTION ? ?1- PreCancer ?Schedule for treatment (LN2) ?2- Severe dysplastic ?Schedule surgery ?

## 2021-07-11 ENCOUNTER — Ambulatory Visit (INDEPENDENT_AMBULATORY_CARE_PROVIDER_SITE_OTHER): Payer: PRIVATE HEALTH INSURANCE | Admitting: Dermatology

## 2021-07-11 ENCOUNTER — Encounter: Payer: Self-pay | Admitting: Dermatology

## 2021-07-11 DIAGNOSIS — D235 Other benign neoplasm of skin of trunk: Secondary | ICD-10-CM | POA: Diagnosis not present

## 2021-07-11 DIAGNOSIS — D239 Other benign neoplasm of skin, unspecified: Secondary | ICD-10-CM

## 2021-07-11 DIAGNOSIS — L570 Actinic keratosis: Secondary | ICD-10-CM

## 2021-07-11 MED ORDER — MUPIROCIN 2 % EX OINT: 1.0000 "application " | TOPICAL_OINTMENT | Freq: Every day | CUTANEOUS | 0 refills | Status: DC

## 2021-07-11 NOTE — Progress Notes (Signed)
? ?  Follow-Up Visit ?  ?Subjective  ?Nicholas Cherry is a 56 y.o. male who presents for the following: Severe Dysplastic nevus, bx proven (left infrascapular, pt presents for excision). ? ?The following portions of the chart were reviewed this encounter and updated as appropriate:  ? Tobacco  Allergies  Meds  Problems  Med Hx  Surg Hx  Fam Hx   ?  ?Review of Systems:  No other skin or systemic complaints except as noted in HPI or Assessment and Plan. ? ?Objective  ?Well appearing patient in no apparent distress; mood and affect are within normal limits. ? ?A focused examination was performed including back. Relevant physical exam findings are noted in the Assessment and Plan. ? ?left infrascapular ?Pink bx site ? ?Left lat infraclavicular ?Healing biopsy site ? ? ?Assessment & Plan  ?Dysplastic nevus - biopsy proven Severe ?left infrascapular ? ?Start Mupirocin oint qd to excision site ? ?Skin excision - left infrascapular ? ?Lesion length (cm):  1.2 ?Lesion width (cm):  1 ?Margin per side (cm):  0.2 ?Total excision diameter (cm):  1.6 ?Informed consent: discussed and consent obtained   ?Timeout: patient name, date of birth, surgical site, and procedure verified   ?Procedure prep:  Patient was prepped and draped in usual sterile fashion ?Prep type:  Isopropyl alcohol and povidone-iodine ?Anesthesia: the lesion was anesthetized in a standard fashion   ?Anesthetic:  1% lidocaine w/ epinephrine 1-100,000 buffered w/ 8.4% NaHCO3 ?Instrument used: #15 blade   ?Hemostasis achieved with: pressure   ?Hemostasis achieved with comment:  Electrocautery ?Outcome: patient tolerated procedure well with no complications   ?Post-procedure details: sterile dressing applied and wound care instructions given   ?Dressing type: bandage, pressure dressing and bacitracin (Mupirocin)   ? ?Skin repair - left infrascapular ?Complexity:  Complex ?Final length (cm):  4 ?Reason for type of repair: reduce tension to allow closure, reduce  the risk of dehiscence, infection, and necrosis, reduce subcutaneous dead space and avoid a hematoma, allow closure of the large defect, preserve normal anatomy, preserve normal anatomical and functional relationships and enhance both functionality and cosmetic results   ?Undermining: area extensively undermined   ?Undermining comment:  Undermining Defect 1.4 cm ?Subcutaneous layers (deep stitches):  ?Suture size:  2-0 ?Suture type: Vicryl (polyglactin 910)   ?Subcutaneous suture technique: Inverted Dermal. ?Fine/surface layer approximation (top stitches):  ?Suture size:  3-0 ?Stitches: simple running   ?Suture removal (days):  7 ?Hemostasis achieved with: pressure ?Outcome: patient tolerated procedure well with no complications   ?Post-procedure details: sterile dressing applied and wound care instructions given   ?Dressing type: bandage, pressure dressing and bacitracin (Mupirocin)   ? ?mupirocin ointment (BACTROBAN) 2 % - left infrascapular ?Apply 1 application. topically daily. With dressing changes ? ?Specimen 1 - Surgical pathology ?Differential Diagnosis: D48.5 Dysplastic nevus, bx proven ? ?Check Margins: yes ?Pink bx site ?[id number] ? ?AK (actinic keratosis) ?Left lat infraclavicular ?Biopsy proven ?Will plan LN2 at post op appointment ? ?Return in about 1 week (around 07/18/2021) for suture removal. ? ?I, Ashok Cordia, CMA, am acting as scribe for Sarina Ser, MD . ?Documentation: I have reviewed the above documentation for accuracy and completeness, and I agree with the above. ? ?Sarina Ser, MD ? ?

## 2021-07-11 NOTE — Patient Instructions (Signed)
Wound Care Instructions ? ?On the day following your surgery, you should begin doing daily dressing changes: ?Remove the old dressing and discard it. ?Cleanse the wound gently with tap water. This may be done in the shower or by placing a wet gauze pad directly on the wound and letting it soak for several minutes. ?It is important to gently remove any dried blood from the wound in order to encourage healing. This may be done by gently rolling a moistened Q-tip on the dried blood. Do not pick at the wound. ?If the wound should start to bleed, continue cleaning the wound, then place a moist gauze pad on the wound and hold pressure for a few minutes.  ?Make sure you then dry the skin surrounding the wound completely or the tape will not stick to the skin. Do not use cotton balls on the wound. ?After the wound is clean and dry, apply the ointment gently with a Q-tip. ?Cut a non-stick pad to fit the size of the wound. Lay the pad flush to the wound. If the wound is draining, you may want to reinforce it with a small amount of gauze on top of the non-stick pad for a little added compression to the area. ?Use the tape to seal the area completely. ?Select from the following with respect to your individual situation: ?If your wound has been stitched closed: continue the above steps 1-8 at least daily until your sutures are removed. ?If your wound has been left open to heal: continue steps 1-8 at least daily for the first 3-4 weeks. ?We would like for you to take a few extra precautions for at least the next week. ?Sleep with your head elevated on pillows if our wound is on your head. ?Do not bend over or lift heavy items to reduce the chance of elevated blood pressure to the wound ?Do not participate in particularly strenuous activities. ? ? ?Below is a list of dressing supplies you might need.  ?Cotton-tipped applicators - Q-tips ?Gauze pads (2x2 and/or 4x4) - All-Purpose Sponges ?Non-stick dressing material - Telfa ?Tape -  Paper or Hypafix ?New and clean tube of petroleum jelly - Vaseline  ? ? ?Comments on Post-Operative Period ?Slight swelling and redness often appear around the wound. This is normal and will disappear within several days following the surgery. ?The healing wound will drain a brownish-red-yellow discharge during healing. This is a normal phase of wound healing. As the wound begins to heal, the drainage may increase in amount. Again, this drainage is normal. ?Notify us if the drainage becomes persistently bloody, excessively swollen, or intensely painful or develops a foul odor or red streaks.  ?If you should experience mild discomfort during the healing phase, you may take an aspirin-free medication such as Tylenol (acetaminophen). Notify us if the discomfort is severe or persistent. Avoid alcoholic beverages when taking pain medicine. ? ?In Case of Wound Hemorrhage ?A wound hemorrhage is when the bandage suddenly becomes soaked with bright red blood and flows profusely. If this happens, sit down or lie down with your head elevated. If the wound has a dressing on it, do not remove the dressing. Apply pressure to the existing gauze. If the wound is not covered, use a gauze pad to apply pressure and continue applying the pressure for 20 minutes without peeking. DO NOT COVER THE WOUND WITH A LARGE TOWEL OR WASH CLOTH. Release your hand from the wound site but do not remove the dressing. If the bleeding has stopped,   gently clean around the wound. Leave the dressing in place for 24 hours if possible. This wait time allows the blood vessels to close off so that you do not spark a new round of bleeding by disrupting the newly clotted blood vessels with an immediate dressing change. If the bleeding does not subside, continue to hold pressure. If matters are out of your control, contact an After Hours clinic or go to the Emergency Room. ? ?If You Need Anything After Your Visit ? ?If you have any questions or concerns for your  doctor, please call our main line at [phone] and press option 4 to reach your doctor's medical assistant. If no one answers, please leave a voicemail as directed and we will return your call as soon as possible. Messages left after 4 pm will be answered the following business day.  ? ?You may also send us a message via MyChart. We typically respond to MyChart messages within 1-2 business days. ? ?For prescription refills, please ask your pharmacy to contact our office. Our fax number is [phone]. ? ?If you have an urgent issue when the clinic is closed that cannot wait until the next business day, you can page your doctor at the number below.   ? ?Please note that while we do our best to be available for urgent issues outside of office hours, we are not available 24/7.  ? ?If you have an urgent issue and are unable to reach us, you may choose to seek medical care at your doctor's office, retail clinic, urgent care center, or emergency room. ? ?If you have a medical emergency, please immediately call 911 or go to the emergency department. ? ?Pager Numbers ? ?- Dr. Kowalski: [phone] ? ?- Dr. Moye: [phone] ? ?- Dr. Stewart: [phone] ? ?In the event of inclement weather, please call our main line at [phone] for an update on the status of any delays or closures. ? ?Dermatology Medication Tips: ?Please keep the boxes that topical medications come in in order to help keep track of the instructions about where and how to use these. Pharmacies typically print the medication instructions only on the boxes and not directly on the medication tubes.  ? ?If your medication is too expensive, please contact our office at [phone] option 4 or send us a message through MyChart.  ? ?We are unable to tell what your co-pay for medications will be in advance as this is different depending on your insurance coverage. However, we may be able to find a substitute medication at lower cost or fill out paperwork  to get insurance to cover a needed medication.  ? ?If a prior authorization is required to get your medication covered by your insurance company, please allow us 1-2 business days to complete this process. ? ?Drug prices often vary depending on where the prescription is filled and some pharmacies may offer cheaper prices. ? ?The website www.goodrx.com contains coupons for medications through different pharmacies. The prices here do not account for what the cost may be with help from insurance (it may be cheaper with your insurance), but the website can give you the price if you did not use any insurance.  ?- You can print the associated coupon and take it with your prescription to the pharmacy.  ?- You may also stop by our office during regular business hours and pick up a GoodRx coupon card.  ?- If you need your prescription sent electronically to a different pharmacy, notify our office through   [facility] MyChart or by phone at [phone] option 4. ? ? ? ? ?Si Usted Necesita Algo Despu?s de Su Visita ? ?Tambi?n puede enviarnos un mensaje a trav?s de MyChart. Por lo general respondemos a los mensajes de MyChart en el transcurso de 1 a 2 d?as h?biles. ? ?Para renovar recetas, por favor pida a su farmacia que se ponga en contacto con nuestra oficina. Nuestro n?mero de fax es el [phone]. ? ?Si tiene un asunto urgente cuando la cl?nica est? cerrada y que no puede esperar hasta el siguiente d?a h?bil, puede llamar/localizar a su doctor(a) al n?mero que aparece a continuaci?n.  ? ?Por favor, tenga en cuenta que aunque hacemos todo lo posible para estar disponibles para asuntos urgentes fuera del horario de oficina, no estamos disponibles las 24 horas del d?a, los 7 d?as de la semana.  ? ?Si tiene un problema urgente y no puede comunicarse con nosotros, puede optar por buscar atenci?n m?dica  en el consultorio de su doctor(a), en una cl?nica privada, en un centro de atenci?n urgente o en una sala de  emergencias. ? ?Si tiene una emergencia m?dica, por favor llame inmediatamente al 911 o vaya a la sala de emergencias. ? ?N?meros de b?per ? ?- Dr. Kowalski: [phone] ? ?- Dra. Moye: [phone] ? ?- Dra. St

## 2021-07-17 ENCOUNTER — Ambulatory Visit (INDEPENDENT_AMBULATORY_CARE_PROVIDER_SITE_OTHER): Payer: 59 | Admitting: Dermatology

## 2021-07-17 DIAGNOSIS — Z4802 Encounter for removal of sutures: Secondary | ICD-10-CM

## 2021-07-17 DIAGNOSIS — D235 Other benign neoplasm of skin of trunk: Secondary | ICD-10-CM

## 2021-07-17 DIAGNOSIS — L570 Actinic keratosis: Secondary | ICD-10-CM

## 2021-07-17 DIAGNOSIS — D239 Other benign neoplasm of skin, unspecified: Secondary | ICD-10-CM

## 2021-07-17 NOTE — Patient Instructions (Signed)

## 2021-07-17 NOTE — Progress Notes (Signed)
   Follow-Up Visit   Subjective  Nicholas Cherry is a 56 y.o. male who presents for the following: Follow-up (Post op left infrascapular - Dysplastic nevus Margins free).  The following portions of the chart were reviewed this encounter and updated as appropriate:   Tobacco  Allergies  Meds  Problems  Med Hx  Surg Hx  Fam Hx     Review of Systems:  No other skin or systemic complaints except as noted in HPI or Assessment and Plan.  Objective  Well appearing patient in no apparent distress; mood and affect are within normal limits.  A focused examination was performed including back. Relevant physical exam findings are noted in the Assessment and Plan.  Left infrascapular Healing excision site  Left lat infraclavicular Healing biopsy site   Assessment & Plan  Dysplastic nevus Left infrascapular  Encounter for Removal of Sutures - Incision site at the left infrascapular is clean, dry and intact - Wound cleansed, sutures removed, wound cleansed and steri strips applied.  - Discussed pathology results showing dysplastic nevus margins free.  - Patient advised to keep steri-strips dry until they fall off. - Scars remodel for a full year. - Once steri-strips fall off, patient can apply over-the-counter silicone scar cream each night to help with scar remodeling if desired. - Patient advised to call with any concerns or if they notice any new or changing lesions.  AK (actinic keratosis) Left lat infraclavicular Biopsy-proven.  See path Will plan to treat with LN2 on follow up  Return for Follow up as scheduled.  I, Ashok Cordia, CMA, am acting as scribe for Sarina Ser, MD . Documentation: I have reviewed the above documentation for accuracy and completeness, and I agree with the above.  Sarina Ser, MD

## 2021-07-18 ENCOUNTER — Ambulatory Visit: Payer: 59 | Admitting: Dermatology

## 2021-07-19 ENCOUNTER — Encounter: Payer: Self-pay | Admitting: Dermatology

## 2021-08-01 ENCOUNTER — Encounter: Payer: Self-pay | Admitting: Dermatology

## 2021-08-24 ENCOUNTER — Ambulatory Visit (INDEPENDENT_AMBULATORY_CARE_PROVIDER_SITE_OTHER): Payer: PRIVATE HEALTH INSURANCE | Admitting: Dermatology

## 2021-08-24 DIAGNOSIS — L409 Psoriasis, unspecified: Secondary | ICD-10-CM | POA: Diagnosis not present

## 2021-08-24 DIAGNOSIS — T8130XA Disruption of wound, unspecified, initial encounter: Secondary | ICD-10-CM

## 2021-08-24 DIAGNOSIS — L820 Inflamed seborrheic keratosis: Secondary | ICD-10-CM

## 2021-08-24 DIAGNOSIS — L578 Other skin changes due to chronic exposure to nonionizing radiation: Secondary | ICD-10-CM | POA: Diagnosis not present

## 2021-08-24 DIAGNOSIS — L570 Actinic keratosis: Secondary | ICD-10-CM

## 2021-08-24 DIAGNOSIS — Z1283 Encounter for screening for malignant neoplasm of skin: Secondary | ICD-10-CM | POA: Diagnosis not present

## 2021-08-24 DIAGNOSIS — L814 Other melanin hyperpigmentation: Secondary | ICD-10-CM

## 2021-08-24 DIAGNOSIS — D229 Melanocytic nevi, unspecified: Secondary | ICD-10-CM

## 2021-08-24 DIAGNOSIS — L821 Other seborrheic keratosis: Secondary | ICD-10-CM

## 2021-08-24 NOTE — Progress Notes (Unsigned)
Follow-Up Visit   Subjective  Nicholas Cherry is a 56 y.o. male who presents for the following: Psoriasis (Patient has been off of Stelara for a few years now due to insurance changes. He uses Mometasone 0.1% cream PRN to aa's.) and Actinic Keratosis (L lat infra clavicular - bx proven, patient is here today for treatment).  The following portions of the chart were reviewed this encounter and updated as appropriate:   Tobacco  Allergies  Meds  Problems  Med Hx  Surg Hx  Fam Hx     Review of Systems:  No other skin or systemic complaints except as noted in HPI or Assessment and Plan.  Objective  Well appearing patient in no apparent distress; mood and affect are within normal limits.  All skin waist up examined.  L infra scapular x 1 Erythematous thin papules/macules with gritty scale.   L pretibial x 3 Erythematous stuck-on, waxy papule or plaque  B/L hands A few scaly patches on the hands  L infrascapular Spitting suture.   Assessment & Plan  AK (actinic keratosis) L infra scapular x 1 Bx proven on the L lat infra scapular   Destruction of lesion - L infra scapular x 1 Complexity: simple   Destruction method: cryotherapy   Informed consent: discussed and consent obtained   Timeout:  patient name, date of birth, surgical site, and procedure verified Lesion destroyed using liquid nitrogen: Yes   Region frozen until ice ball extended beyond lesion: Yes   Outcome: patient tolerated procedure well with no complications   Post-procedure details: wound care instructions given    Inflamed seborrheic keratosis L pretibial x 3 Symptomatic, irritating, patient would like treated. Destruction of lesion - L pretibial x 3 Complexity: simple   Destruction method: cryotherapy   Informed consent: discussed and consent obtained   Timeout:  patient name, date of birth, surgical site, and procedure verified Lesion destroyed using liquid nitrogen: Yes   Region frozen until  ice ball extended beyond lesion: Yes   Outcome: patient tolerated procedure well with no complications   Post-procedure details: wound care instructions given    Destruction of lesion - L pretibial x 3  Psoriasis B/L hands Chronic and persistent condition with duration or expected duration over one year. Condition is symptomatic / bothersome to patient. Not to goal. Psoriasis is a chronic non-curable, but treatable genetic/hereditary disease that may have other systemic features affecting other organ systems such as joints (Psoriatic Arthritis). It is associated with an increased risk of inflammatory bowel disease, heart disease, non-alcoholic fatty liver disease, and depression.    Continue Mometasone 0.1% cream to aa's QD up to 5 days per week.   Related Medications ustekinumab (STELARA) 90 MG/ML SOSY injection Inject 1 mL (90 mg total) into the skin as directed. At weeks 0 & 4.  Spitting suture, initial encounter L infrascapular Areas cleaned with Puracyn spray, Mupirocin 2% ointment applied to aa and covered with bandage.  Skin cancer screening  Actinic Damage - chronic, secondary to cumulative UV radiation exposure/sun exposure over time - diffuse scaly erythematous macules with underlying dyspigmentation - Recommend daily broad spectrum sunscreen SPF 30+ to sun-exposed areas, reapply every 2 hours as needed.  - Recommend staying in the shade or wearing long sleeves, sun glasses (UVA+UVB protection) and wide brim hats (4-inch brim around the entire circumference of the hat). - Call for new or changing lesions.  Seborrheic Keratoses - Stuck-on, waxy, tan-brown papules and/or plaques  - Benign-appearing -  Discussed benign etiology and prognosis. - Observe - Call for any changes  Lentigines - Scattered tan macules - Due to sun exposure - Benign-appering, observe - Recommend daily broad spectrum sunscreen SPF 30+ to sun-exposed areas, reapply every 2 hours as needed. - Call  for any changes  Melanocytic Nevi - Tan-brown and/or pink-flesh-colored symmetric macules and papules - Benign appearing on exam today - Observation - Call clinic for new or changing moles - Recommend daily use of broad spectrum spf 30+ sunscreen to sun-exposed areas.   Return in about 8 months (around 04/26/2022) for TBSE.  Luther Redo, CMA, am acting as scribe for Sarina Ser, MD . Documentation: I have reviewed the above documentation for accuracy and completeness, and I agree with the above.  Sarina Ser, MD

## 2021-08-24 NOTE — Patient Instructions (Signed)
Due to recent changes in healthcare laws, you may see results of your pathology and/or laboratory studies on MyChart before the doctors have had a chance to review them. We understand that in some cases there may be results that are confusing or concerning to you. Please understand that not all results are received at the same time and often the doctors may need to interpret multiple results in order to provide you with the best plan of care or course of treatment. Therefore, we ask that you please give us 2 business days to thoroughly review all your results before contacting the office for clarification. Should we see a critical lab result, you will be contacted sooner.   If You Need Anything After Your Visit  If you have any questions or concerns for your doctor, please call our main line at [phone] and press option 4 to reach your doctor's medical assistant. If no one answers, please leave a voicemail as directed and we will return your call as soon as possible. Messages left after 4 pm will be answered the following business day.   You may also send us a message via MyChart. We typically respond to MyChart messages within 1-2 business days.  For prescription refills, please ask your pharmacy to contact our office. Our fax number is [phone].  If you have an urgent issue when the clinic is closed that cannot wait until the next business day, you can page your doctor at the number below.    Please note that while we do our best to be available for urgent issues outside of office hours, we are not available 24/7.   If you have an urgent issue and are unable to reach us, you may choose to seek medical care at your doctor's office, retail clinic, urgent care center, or emergency room.  If you have a medical emergency, please immediately call 911 or go to the emergency department.  Pager Numbers  - Dr. Kowalski: [phone]  - Dr. Moye: [phone]  - Dr. Stewart:  [phone]  In the event of inclement weather, please call our main line at [phone] for an update on the status of any delays or closures.  Dermatology Medication Tips: Please keep the boxes that topical medications come in in order to help keep track of the instructions about where and how to use these. Pharmacies typically print the medication instructions only on the boxes and not directly on the medication tubes.   If your medication is too expensive, please contact our office at [phone] option 4 or send us a message through MyChart.   We are unable to tell what your co-pay for medications will be in advance as this is different depending on your insurance coverage. However, we may be able to find a substitute medication at lower cost or fill out paperwork to get insurance to cover a needed medication.   If a prior authorization is required to get your medication covered by your insurance company, please allow us 1-2 business days to complete this process.  Drug prices often vary depending on where the prescription is filled and some pharmacies may offer cheaper prices.  The website www.goodrx.com contains coupons for medications through different pharmacies. The prices here do not account for what the cost may be with help from insurance (it may be cheaper with your insurance), but the website can give you the price if you did not use any insurance.  - You can print the associated coupon and take it with   your prescription to the pharmacy.  - You may also stop by our office during regular business hours and pick up a GoodRx coupon card.  - If you need your prescription sent electronically to a different pharmacy, notify our office through [facility] MyChart or by phone at [phone] option 4.     Si Usted Necesita Algo Despus de Su Visita  Tambin puede enviarnos un mensaje a travs de MyChart. Por lo general respondemos a los mensajes de MyChart en el transcurso de 1 a 2  das hbiles.  Para renovar recetas, por favor pida a su farmacia que se ponga en contacto con nuestra oficina. Nuestro nmero de fax es el [phone].  Si tiene un asunto urgente cuando la clnica est cerrada y que no puede esperar hasta el siguiente da hbil, puede llamar/localizar a su doctor(a) al nmero que aparece a continuacin.   Por favor, tenga en cuenta que aunque hacemos todo lo posible para estar disponibles para asuntos urgentes fuera del horario de oficina, no estamos disponibles las 24 horas del da, los 7 das de la semana.   Si tiene un problema urgente y no puede comunicarse con nosotros, puede optar por buscar atencin mdica  en el consultorio de su doctor(a), en una clnica privada, en un centro de atencin urgente o en una sala de emergencias.  Si tiene una emergencia mdica, por favor llame inmediatamente al 911 o vaya a la sala de emergencias.  Nmeros de bper  - Dr. Kowalski: [phone]  - Dra. Moye: [phone]  - Dra. Stewart: [phone]  En caso de inclemencias del tiempo, por favor llame a nuestra lnea principal al [phone] para una actualizacin sobre el estado de cualquier retraso o cierre.  Consejos para la medicacin en dermatologa: Por favor, guarde las cajas en las que vienen los medicamentos de uso tpico para ayudarle a seguir las instrucciones sobre dnde y cmo usarlos. Las farmacias generalmente imprimen las instrucciones del medicamento slo en las cajas y no directamente en los tubos del medicamento.   Si su medicamento es muy caro, por favor, pngase en contacto con nuestra oficina llamando al [phone] y presione la opcin 4 o envenos un mensaje a travs de MyChart.   No podemos decirle cul ser su copago por los medicamentos por adelantado ya que esto es diferente dependiendo de la cobertura de su seguro. Sin embargo, es posible que podamos encontrar un medicamento sustituto a menor costo o llenar un formulario para que el  seguro cubra el medicamento que se considera necesario.   Si se requiere una autorizacin previa para que su compaa de seguros cubra su medicamento, por favor permtanos de 1 a 2 das hbiles para completar este proceso.  Los precios de los medicamentos varan con frecuencia dependiendo del lugar de dnde se surte la receta y alguna farmacias pueden ofrecer precios ms baratos.  El sitio web www.goodrx.com tiene cupones para medicamentos de diferentes farmacias. Los precios aqu no tienen en cuenta lo que podra costar con la ayuda del seguro (puede ser ms barato con su seguro), pero el sitio web puede darle el precio si no utiliz ningn seguro.  - Puede imprimir el cupn correspondiente y llevarlo con su receta a la farmacia.  - Tambin puede pasar por nuestra oficina durante el horario de atencin regular y recoger una tarjeta de cupones de GoodRx.  - Si necesita que su receta se enve electrnicamente a una farmacia diferente, informe a nuestra oficina a travs de [facility]   o por telfono llamando al [phone] y presione la opcin 4.  

## 2021-08-29 ENCOUNTER — Encounter: Payer: Self-pay | Admitting: Dermatology

## 2021-12-20 ENCOUNTER — Telehealth: Payer: Self-pay

## 2021-12-20 NOTE — Telephone Encounter (Signed)
Patient called wanting to start back on Stelara injections. Schedule appointment for additional psoriasis evaluation or start RX?  Pt last seen June 2023 and scheduled 6 month follow up for February 2024. aw

## 2021-12-21 NOTE — Telephone Encounter (Signed)
Patient advised and appointment scheduled. aw

## 2021-12-27 ENCOUNTER — Ambulatory Visit (INDEPENDENT_AMBULATORY_CARE_PROVIDER_SITE_OTHER): Payer: PRIVATE HEALTH INSURANCE | Admitting: Dermatology

## 2021-12-27 VITALS — Wt 227.0 lb

## 2021-12-27 DIAGNOSIS — Z85828 Personal history of other malignant neoplasm of skin: Secondary | ICD-10-CM

## 2021-12-27 DIAGNOSIS — L4050 Arthropathic psoriasis, unspecified: Secondary | ICD-10-CM | POA: Diagnosis not present

## 2021-12-27 DIAGNOSIS — Z8589 Personal history of malignant neoplasm of other organs and systems: Secondary | ICD-10-CM

## 2021-12-27 DIAGNOSIS — L578 Other skin changes due to chronic exposure to nonionizing radiation: Secondary | ICD-10-CM | POA: Diagnosis not present

## 2021-12-27 DIAGNOSIS — Z79899 Other long term (current) drug therapy: Secondary | ICD-10-CM

## 2021-12-27 DIAGNOSIS — L409 Psoriasis, unspecified: Secondary | ICD-10-CM

## 2021-12-27 NOTE — Patient Instructions (Signed)
Due to recent changes in healthcare laws, you may see results of your pathology and/or laboratory studies on MyChart before the doctors have had a chance to review them. We understand that in some cases there may be results that are confusing or concerning to you. Please understand that not all results are received at the same time and often the doctors may need to interpret multiple results in order to provide you with the best plan of care or course of treatment. Therefore, we ask that you please give us 2 business days to thoroughly review all your results before contacting the office for clarification. Should we see a critical lab result, you will be contacted sooner.   If You Need Anything After Your Visit  If you have any questions or concerns for your doctor, please call our main line at [phone] and press option 4 to reach your doctor's medical assistant. If no one answers, please leave a voicemail as directed and we will return your call as soon as possible. Messages left after 4 pm will be answered the following business day.   You may also send us a message via MyChart. We typically respond to MyChart messages within 1-2 business days.  For prescription refills, please ask your pharmacy to contact our office. Our fax number is [phone].  If you have an urgent issue when the clinic is closed that cannot wait until the next business day, you can page your doctor at the number below.    Please note that while we do our best to be available for urgent issues outside of office hours, we are not available 24/7.   If you have an urgent issue and are unable to reach us, you may choose to seek medical care at your doctor's office, retail clinic, urgent care center, or emergency room.  If you have a medical emergency, please immediately call 911 or go to the emergency department.  Pager Numbers  - Dr. Kowalski: [phone]  - Dr. Moye: [phone]  - Dr. Stewart:  [phone]  In the event of inclement weather, please call our main line at [phone] for an update on the status of any delays or closures.  Dermatology Medication Tips: Please keep the boxes that topical medications come in in order to help keep track of the instructions about where and how to use these. Pharmacies typically print the medication instructions only on the boxes and not directly on the medication tubes.   If your medication is too expensive, please contact our office at [phone] option 4 or send us a message through MyChart.   We are unable to tell what your co-pay for medications will be in advance as this is different depending on your insurance coverage. However, we may be able to find a substitute medication at lower cost or fill out paperwork to get insurance to cover a needed medication.   If a prior authorization is required to get your medication covered by your insurance company, please allow us 1-2 business days to complete this process.  Drug prices often vary depending on where the prescription is filled and some pharmacies may offer cheaper prices.  The website www.goodrx.com contains coupons for medications through different pharmacies. The prices here do not account for what the cost may be with help from insurance (it may be cheaper with your insurance), but the website can give you the price if you did not use any insurance.  - You can print the associated coupon and take it with   your prescription to the pharmacy.  - You may also stop by our office during regular business hours and pick up a GoodRx coupon card.  - If you need your prescription sent electronically to a different pharmacy, notify our office through [facility] MyChart or by phone at [phone] option 4.     Si Usted Necesita Algo Despus de Su Visita  Tambin puede enviarnos un mensaje a travs de MyChart. Por lo general respondemos a los mensajes de MyChart en el transcurso de 1 a 2  das hbiles.  Para renovar recetas, por favor pida a su farmacia que se ponga en contacto con nuestra oficina. Nuestro nmero de fax es el [phone].  Si tiene un asunto urgente cuando la clnica est cerrada y que no puede esperar hasta el siguiente da hbil, puede llamar/localizar a su doctor(a) al nmero que aparece a continuacin.   Por favor, tenga en cuenta que aunque hacemos todo lo posible para estar disponibles para asuntos urgentes fuera del horario de oficina, no estamos disponibles las 24 horas del da, los 7 das de la semana.   Si tiene un problema urgente y no puede comunicarse con nosotros, puede optar por buscar atencin mdica  en el consultorio de su doctor(a), en una clnica privada, en un centro de atencin urgente o en una sala de emergencias.  Si tiene una emergencia mdica, por favor llame inmediatamente al 911 o vaya a la sala de emergencias.  Nmeros de bper  - Dr. Kowalski: [phone]  - Dra. Moye: [phone]  - Dra. Stewart: [phone]  En caso de inclemencias del tiempo, por favor llame a nuestra lnea principal al [phone] para una actualizacin sobre el estado de cualquier retraso o cierre.  Consejos para la medicacin en dermatologa: Por favor, guarde las cajas en las que vienen los medicamentos de uso tpico para ayudarle a seguir las instrucciones sobre dnde y cmo usarlos. Las farmacias generalmente imprimen las instrucciones del medicamento slo en las cajas y no directamente en los tubos del medicamento.   Si su medicamento es muy caro, por favor, pngase en contacto con nuestra oficina llamando al [phone] y presione la opcin 4 o envenos un mensaje a travs de MyChart.   No podemos decirle cul ser su copago por los medicamentos por adelantado ya que esto es diferente dependiendo de la cobertura de su seguro. Sin embargo, es posible que podamos encontrar un medicamento sustituto a menor costo o llenar un formulario para que el  seguro cubra el medicamento que se considera necesario.   Si se requiere una autorizacin previa para que su compaa de seguros cubra su medicamento, por favor permtanos de 1 a 2 das hbiles para completar este proceso.  Los precios de los medicamentos varan con frecuencia dependiendo del lugar de dnde se surte la receta y alguna farmacias pueden ofrecer precios ms baratos.  El sitio web www.goodrx.com tiene cupones para medicamentos de diferentes farmacias. Los precios aqu no tienen en cuenta lo que podra costar con la ayuda del seguro (puede ser ms barato con su seguro), pero el sitio web puede darle el precio si no utiliz ningn seguro.  - Puede imprimir el cupn correspondiente y llevarlo con su receta a la farmacia.  - Tambin puede pasar por nuestra oficina durante el horario de atencin regular y recoger una tarjeta de cupones de GoodRx.  - Si necesita que su receta se enve electrnicamente a una farmacia diferente, informe a nuestra oficina a travs de MyChart de [place]   o por telfono llamando al [phone] y presione la opcin 4.  

## 2021-12-27 NOTE — Progress Notes (Signed)
   Follow-Up Visit   Subjective  Nicholas Cherry is a 56 y.o. male who presents for the following: Psoriasis (Patient c/o psoriasis flare on his arms,elbow, past treatment Stelara injections helped, patient would like restart Stelara injections ). Patient c/o joint pain of the knees.   The following portions of the chart were reviewed this encounter and updated as appropriate:   Tobacco  Allergies  Meds  Problems  Med Hx  Surg Hx  Fam Hx     Review of Systems:  No other skin or systemic complaints except as noted in HPI or Assessment and Plan.  Objective  Well appearing patient in no apparent distress; mood and affect are within normal limits.  A focused examination was performed including hands,arms,face . Relevant physical exam findings are noted in the Assessment and Plan.  palms, elbows Plaques on the arms, elbows            Assessment & Plan  Psoriasis With psoriatic arthritis-flared palms, elbows  Psoriasis with Psoriatic arthritis  Psoriasis is a chronic non-curable, but treatable genetic/hereditary disease that may have other systemic features affecting other organ systems such as joints (Psoriatic Arthritis). It is associated with an increased risk of inflammatory bowel disease, heart disease, non-alcoholic fatty liver disease, and depression.    Chronic and persistent condition with duration or expected duration over one year. Condition is bothersome/symptomatic for patient. Currently flared.  "current reported lab reviewed"  Pending normal labs  plan on Stelara injection 90 mg 2 injections  Labs ordered CMP, QuantiFeron-tb gold   Related Procedures QuantiFERON-TB Gold Plus CMP  Related Medications ustekinumab (STELARA) 90 MG/ML SOSY injection Inject 1 mL (90 mg total) into the skin as directed. At weeks 0 & 4.  History of Basal Cell Carcinoma of the Skin Left lateral infraclavicular  - No evidence of recurrence today - Recommend regular full  body skin exams - Recommend daily broad spectrum sunscreen SPF 30+ to sun-exposed areas, reapply every 2 hours as needed.  - Call if any new or changing lesions are noted between office visits   History of Squamous Cell Carcinoma of the Skin Multiple see history - No evidence of recurrence today - No lymphadenopathy - Recommend regular full body skin exams - Recommend daily broad spectrum sunscreen SPF 30+ to sun-exposed areas, reapply every 2 hours as needed.  - Call if any new or changing lesions are noted between office visits   Actinic Damage - chronic, secondary to cumulative UV radiation exposure/sun exposure over time - diffuse scaly erythematous macules with underlying dyspigmentation - Recommend daily broad spectrum sunscreen SPF 30+ to sun-exposed areas, reapply every 2 hours as needed.  - Recommend staying in the shade or wearing long sleeves, sun glasses (UVA+UVB protection) and wide brim hats (4-inch brim around the entire circumference of the hat). - Call for new or changing lesions.  Return in about 6 weeks (around 02/07/2022) for Psoriasis .  IMarye Round, CMA, am acting as scribe for Sarina Ser, MD .  Documentation: I have reviewed the above documentation for accuracy and completeness, and I agree with the above.  Sarina Ser, MD

## 2021-12-30 LAB — COMPREHENSIVE METABOLIC PANEL
ALT: 18 IU/L (ref 0–44)
AST: 21 IU/L (ref 0–40)
Albumin/Globulin Ratio: 2 (ref 1.2–2.2)
Albumin: 4.5 g/dL (ref 3.8–4.9)
Alkaline Phosphatase: 98 IU/L (ref 44–121)
BUN/Creatinine Ratio: 17 (ref 9–20)
BUN: 15 mg/dL (ref 6–24)
Bilirubin Total: 1.8 mg/dL — ABNORMAL HIGH (ref 0.0–1.2)
CO2: 26 mmol/L (ref 20–29)
Calcium: 9.2 mg/dL (ref 8.7–10.2)
Chloride: 99 mmol/L (ref 96–106)
Creatinine, Ser: 0.9 mg/dL (ref 0.76–1.27)
Globulin, Total: 2.2 g/dL (ref 1.5–4.5)
Glucose: 91 mg/dL (ref 70–99)
Potassium: 4.8 mmol/L (ref 3.5–5.2)
Sodium: 139 mmol/L (ref 134–144)
Total Protein: 6.7 g/dL (ref 6.0–8.5)
eGFR: 99 mL/min/{1.73_m2} (ref >59)

## 2021-12-30 LAB — QUANTIFERON-TB GOLD PLUS
QuantiFERON Mitogen Value: 2.67 IU/mL
QuantiFERON Nil Value: 0.03 IU/mL
QuantiFERON TB1 Ag Value: 0.03 IU/mL
QuantiFERON TB2 Ag Value: 0.04 IU/mL
QuantiFERON-TB Gold Plus: NEGATIVE

## 2022-01-03 ENCOUNTER — Telehealth: Payer: Self-pay

## 2022-01-03 MED ORDER — STELARA 90 MG/ML ~~LOC~~ SOSY
90.0000 mg | PREFILLED_SYRINGE | SUBCUTANEOUS | 0 refills | Status: DC
Start: 2022-01-03 — End: 2023-05-26

## 2022-01-03 MED ORDER — STELARA 90 MG/ML ~~LOC~~ SOSY: 90.0000 mg | PREFILLED_SYRINGE | SUBCUTANEOUS | 2 refills | Status: DC

## 2022-01-03 NOTE — Telephone Encounter (Signed)
-----   Message from Ralene Bathe, MD sent at 12/30/2021  5:10 PM EDT ----- Lab from 12/27/2021 showed: Chemistries including liver and kidney all OK. Quantiferon Gold / TB test negative / normal.  Call and advise pt and  Send Rx for Stelara Keep fu appts

## 2022-01-03 NOTE — Telephone Encounter (Signed)
Advised pt of labs, Stelara sent to [facility]

## 2022-01-06 ENCOUNTER — Encounter: Payer: Self-pay | Admitting: Dermatology

## 2022-01-24 ENCOUNTER — Telehealth: Payer: Self-pay

## 2022-01-24 NOTE — Telephone Encounter (Signed)
Patient advised and will be coming to pick these up. Patient agrees if the stelara process does not work, we can switch to [employer]. aw

## 2022-01-24 NOTE — Telephone Encounter (Signed)
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 His current insurance will only pay up to $50 on medications & he is maxing out his copay savings card within one fill. I am still working on this case and the field reimbursement rep is trying to get him over to patient assistance for being "under insured".  I was cleaning out our biologic refrigerator and we do have two samples of Stelara that will be expiring after next month.  If you wanted, we could get him his two loading doses in while we are working on this process?

## 2022-01-25 NOTE — Telephone Encounter (Signed)
Patient's wife given 2 Stelara '90mg'$  samples today.   Lot:LAS'00MG'$  Exp:02/2022

## 2022-02-14 ENCOUNTER — Ambulatory Visit: Payer: PRIVATE HEALTH INSURANCE | Admitting: Dermatology

## 2022-03-10 ENCOUNTER — Emergency Department: Payer: No Typology Code available for payment source

## 2022-03-10 ENCOUNTER — Emergency Department
Admission: EM | Admit: 2022-03-10 | Discharge: 2022-03-10 | Disposition: A | Discharge status: Home / Self Care | Payer: No Typology Code available for payment source | Attending: Emergency Medicine | Admitting: Emergency Medicine

## 2022-03-10 ENCOUNTER — Other Ambulatory Visit: Payer: Self-pay

## 2022-03-10 DIAGNOSIS — S0990XA Unspecified injury of head, initial encounter: Secondary | ICD-10-CM | POA: Diagnosis not present

## 2022-03-10 DIAGNOSIS — R911 Solitary pulmonary nodule: Secondary | ICD-10-CM | POA: Diagnosis not present

## 2022-03-10 DIAGNOSIS — R079 Chest pain, unspecified: Secondary | ICD-10-CM | POA: Insufficient documentation

## 2022-03-10 DIAGNOSIS — Y92410 Unspecified street and highway as the place of occurrence of the external cause: Secondary | ICD-10-CM | POA: Insufficient documentation

## 2022-03-10 DIAGNOSIS — M79604 Pain in right leg: Secondary | ICD-10-CM | POA: Diagnosis not present

## 2022-03-10 DIAGNOSIS — M79642 Pain in left hand: Secondary | ICD-10-CM | POA: Insufficient documentation

## 2022-03-10 DIAGNOSIS — Z23 Encounter for immunization: Secondary | ICD-10-CM | POA: Insufficient documentation

## 2022-03-10 DIAGNOSIS — S161XXA Strain of muscle, fascia and tendon at neck level, initial encounter: Secondary | ICD-10-CM | POA: Insufficient documentation

## 2022-03-10 DIAGNOSIS — S199XXA Unspecified injury of neck, initial encounter: Secondary | ICD-10-CM | POA: Diagnosis present

## 2022-03-10 DIAGNOSIS — T1490XA Injury, unspecified, initial encounter: Secondary | ICD-10-CM

## 2022-03-10 DIAGNOSIS — R109 Unspecified abdominal pain: Secondary | ICD-10-CM | POA: Diagnosis not present

## 2022-03-10 DIAGNOSIS — M545 Low back pain, unspecified: Secondary | ICD-10-CM | POA: Diagnosis not present

## 2022-03-10 LAB — CBC WITH DIFFERENTIAL/PLATELET
Abs Immature Granulocytes: 0.05 10*3/uL (ref 0.00–0.07)
Basophils Absolute: 0.1 10*3/uL (ref 0.0–0.1)
Basophils Relative: 1 %
Eosinophils Absolute: 0.2 10*3/uL (ref 0.0–0.5)
Eosinophils Relative: 2 %
HCT: 46.4 % (ref 39.0–52.0)
Hemoglobin: 14.9 g/dL (ref 13.0–17.0)
Immature Granulocytes: 1 %
Lymphocytes Relative: 18 %
Lymphs Abs: 1.2 10*3/uL (ref 0.7–4.0)
MCH: 28.8 pg (ref 26.0–34.0)
MCHC: 32.1 g/dL (ref 30.0–36.0)
MCV: 89.7 fL (ref 80.0–100.0)
Monocytes Absolute: 0.5 10*3/uL (ref 0.1–1.0)
Monocytes Relative: 7 %
Neutro Abs: 5 10*3/uL (ref 1.7–7.7)
Neutrophils Relative %: 71 %
Platelets: 215 10*3/uL (ref 150–400)
RBC: 5.17 MIL/uL (ref 4.22–5.81)
RDW: 12.2 % (ref 11.5–15.5)
WBC: 7 10*3/uL (ref 4.0–10.5)
nRBC: 0 % (ref 0.0–0.2)

## 2022-03-10 LAB — BASIC METABOLIC PANEL
Anion gap: 6 (ref 5–15)
BUN: 15 mg/dL (ref 6–20)
CO2: 23 mmol/L (ref 22–32)
Calcium: 9.5 mg/dL (ref 8.9–10.3)
Chloride: 110 mmol/L (ref 98–111)
Creatinine, Ser: 0.75 mg/dL (ref 0.61–1.24)
GFR, Estimated: >60 mL/min (ref >60)
Glucose, Bld: 62 mg/dL — ABNORMAL LOW (ref 70–99)
Potassium: 4.6 mmol/L (ref 3.5–5.1)
Sodium: 139 mmol/L (ref 135–145)

## 2022-03-10 LAB — TROPONIN I (HIGH SENSITIVITY): Troponin I (High Sensitivity): <2 ng/L (ref <18)

## 2022-03-10 MED ORDER — CYCLOBENZAPRINE HCL 10 MG PO TABS: 10.0000 mg | ORAL_TABLET | Freq: Three times a day (TID) | ORAL | 0 refills | Status: DC | PRN

## 2022-03-10 MED ORDER — KETOROLAC TROMETHAMINE 30 MG/ML IJ SOLN
30.0000 mg | Freq: Once | INTRAMUSCULAR | Status: AC
  Administered 2022-03-10: 30 mg via INTRAVENOUS
  Filled 2022-03-10: qty 1

## 2022-03-10 MED ORDER — TETANUS-DIPHTH-ACELL PERTUSSIS 5-2.5-18.5 LF-MCG/0.5 IM SUSY
0.5000 mL | PREFILLED_SYRINGE | Freq: Once | INTRAMUSCULAR | Status: AC
  Administered 2022-03-10: 0.5 mL via INTRAMUSCULAR
  Filled 2022-03-10: qty 0.5

## 2022-03-10 MED ORDER — IOHEXOL 300 MG/ML  SOLN
100.0000 mL | Freq: Once | INTRAMUSCULAR | Status: AC | PRN
  Administered 2022-03-10: 100 mL via INTRAVENOUS

## 2022-03-10 MED ORDER — OXYCODONE-ACETAMINOPHEN 5-325 MG PO TABS
1.0000 | ORAL_TABLET | Freq: Once | ORAL | Status: AC
  Administered 2022-03-10: 1 via ORAL
  Filled 2022-03-10: qty 1

## 2022-03-10 MED ORDER — OXYCODONE-ACETAMINOPHEN 5-325 MG PO TABS: 1.0000 | ORAL_TABLET | Freq: Four times a day (QID) | ORAL | 0 refills | Status: DC | PRN
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
## 2022-03-15 DIAGNOSIS — R911 Solitary pulmonary nodule: Secondary | ICD-10-CM | POA: Insufficient documentation

## 2022-03-15 DIAGNOSIS — I700 Atherosclerosis of aorta: Secondary | ICD-10-CM | POA: Insufficient documentation

## 2022-03-19 ENCOUNTER — Encounter: Payer: Self-pay | Admitting: Dermatology

## 2022-03-19 ENCOUNTER — Ambulatory Visit (INDEPENDENT_AMBULATORY_CARE_PROVIDER_SITE_OTHER): Payer: PRIVATE HEALTH INSURANCE | Admitting: Dermatology

## 2022-03-19 DIAGNOSIS — D485 Neoplasm of uncertain behavior of skin: Secondary | ICD-10-CM

## 2022-03-19 DIAGNOSIS — Z79899 Other long term (current) drug therapy: Secondary | ICD-10-CM | POA: Diagnosis not present

## 2022-03-19 DIAGNOSIS — L578 Other skin changes due to chronic exposure to nonionizing radiation: Secondary | ICD-10-CM

## 2022-03-19 DIAGNOSIS — D225 Melanocytic nevi of trunk: Secondary | ICD-10-CM

## 2022-03-19 DIAGNOSIS — L409 Psoriasis, unspecified: Secondary | ICD-10-CM

## 2022-03-19 DIAGNOSIS — Z8589 Personal history of malignant neoplasm of other organs and systems: Secondary | ICD-10-CM

## 2022-03-19 DIAGNOSIS — L4050 Arthropathic psoriasis, unspecified: Secondary | ICD-10-CM

## 2022-03-19 NOTE — Patient Instructions (Addendum)
Electrodesiccation and Curettage ("Scrape and Burn") Wound Care Instructions  Leave the original bandage on for 24 hours if possible.  If the bandage becomes soaked or soiled before that time, it is OK to remove it and examine the wound.  A small amount of post-operative bleeding is normal.  If excessive bleeding occurs, remove the bandage, place gauze over the site and apply continuous pressure (no peeking) over the area for 30 minutes. If this does not work, please call our clinic as soon as possible or page your doctor if it is after hours.   Once a day, cleanse the wound with soap and water. It is fine to shower. If a thick crust develops you may use a Q-tip dipped into dilute hydrogen peroxide (mix 1:1 with water) to dissolve it.  Hydrogen peroxide can slow the healing process, so use it only as needed.    After washing, apply petroleum jelly (Vaseline) or an antibiotic ointment if your doctor prescribed one for you, followed by a bandage.    For best healing, the wound should be covered with a layer of ointment at all times. If you are not able to keep the area covered with a bandage to hold the ointment in place, this may mean re-applying the ointment several times a day.  Continue this wound care until the wound has healed and is no longer open. It may take several weeks for the wound to heal and close.  Itching and mild discomfort is normal during the healing process.  If you have any discomfort, you can take Tylenol (acetaminophen) or ibuprofen as directed on the bottle. (Please do not take these if you have an allergy to them or cannot take them for another reason).  Some redness, tenderness and white or yellow material in the wound is normal healing.  If the area becomes very sore and red, or develops a thick yellow-green material (pus), it may be infected; please notify us.    Wound healing continues for up to one year following surgery. It is not unusual to experience pain in the scar  from time to time during the interval.  If the pain becomes severe or the scar thickens, you should notify the office.    A slight amount of redness in a scar is expected for the first six months.  After six months, the redness will fade and the scar will soften and fade.  The color difference becomes less noticeable with time.  If there are any problems, return for a post-op surgery check at your earliest convenience.  To improve the appearance of the scar, you can use silicone scar gel, cream, or sheets (such as Mederma or Serica) every night for up to one year. These are available over the counter (without a prescription).  Please call our office at [phone] for any questions or concerns.     Due to recent changes in healthcare laws, you may see results of your pathology and/or laboratory studies on MyChart before the doctors have had a chance to review them. We understand that in some cases there may be results that are confusing or concerning to you. Please understand that not all results are received at the same time and often the doctors may need to interpret multiple results in order to provide you with the best plan of care or course of treatment. Therefore, we ask that you please give us 2 business days to thoroughly review all your results before contacting the office for clarification. Should   we see a critical lab result, you will be contacted sooner.   If You Need Anything After Your Visit  If you have any questions or concerns for your doctor, please call our main line at [phone] and press option 4 to reach your doctor's medical assistant. If no one answers, please leave a voicemail as directed and we will return your call as soon as possible. Messages left after 4 pm will be answered the following business day.   You may also send us a message via MyChart. We typically respond to MyChart messages within 1-2 business days.  For prescription refills, please ask your  pharmacy to contact our office. Our fax number is [phone].  If you have an urgent issue when the clinic is closed that cannot wait until the next business day, you can page your doctor at the number below.    Please note that while we do our best to be available for urgent issues outside of office hours, we are not available 24/7.   If you have an urgent issue and are unable to reach us, you may choose to seek medical care at your doctor's office, retail clinic, urgent care center, or emergency room.  If you have a medical emergency, please immediately call 911 or go to the emergency department.  Pager Numbers  - Dr. Kowalski: [phone]  - Dr. Moye: [phone]  - Dr. Stewart: [phone]  In the event of inclement weather, please call our main line at [phone] for an update on the status of any delays or closures.  Dermatology Medication Tips: Please keep the boxes that topical medications come in in order to help keep track of the instructions about where and how to use these. Pharmacies typically print the medication instructions only on the boxes and not directly on the medication tubes.   If your medication is too expensive, please contact our office at [phone] option 4 or send us a message through MyChart.   We are unable to tell what your co-pay for medications will be in advance as this is different depending on your insurance coverage. However, we may be able to find a substitute medication at lower cost or fill out paperwork to get insurance to cover a needed medication.   If a prior authorization is required to get your medication covered by your insurance company, please allow us 1-2 business days to complete this process.  Drug prices often vary depending on where the prescription is filled and some pharmacies may offer cheaper prices.  The website www.goodrx.com contains coupons for medications through different pharmacies. The prices here do not  account for what the cost may be with help from insurance (it may be cheaper with your insurance), but the website can give you the price if you did not use any insurance.  - You can print the associated coupon and take it with your prescription to the pharmacy.  - You may also stop by our office during regular business hours and pick up a GoodRx coupon card.  - If you need your prescription sent electronically to a different pharmacy, notify our office through [facility] MyChart or by phone at [phone] option 4.     Si Usted Necesita Algo Despus de Su Visita  Tambin puede enviarnos un mensaje a travs de MyChart. Por lo general respondemos a los mensajes de MyChart en el transcurso de 1 a 2 das hbiles.  Para renovar recetas, por favor pida a su farmacia que se ponga en   contacto con nuestra oficina. Nuestro nmero de fax es el [phone].  Si tiene un asunto urgente cuando la clnica est cerrada y que no puede esperar hasta el siguiente da hbil, puede llamar/localizar a su doctor(a) al nmero que aparece a continuacin.   Por favor, tenga en cuenta que aunque hacemos todo lo posible para estar disponibles para asuntos urgentes fuera del horario de oficina, no estamos disponibles las 24 horas del da, los 7 das de la semana.   Si tiene un problema urgente y no puede comunicarse con nosotros, puede optar por buscar atencin mdica  en el consultorio de su doctor(a), en una clnica privada, en un centro de atencin urgente o en una sala de emergencias.  Si tiene una emergencia mdica, por favor llame inmediatamente al 911 o vaya a la sala de emergencias.  Nmeros de bper  - Dr. Kowalski: [phone]  - Dra. Moye: [phone]  - Dra. Stewart: [phone]  En caso de inclemencias del tiempo, por favor llame a nuestra lnea principal al [phone] para una actualizacin sobre el estado de cualquier retraso o cierre.  Consejos para la medicacin en dermatologa: Por  favor, guarde las cajas en las que vienen los medicamentos de uso tpico para ayudarle a seguir las instrucciones sobre dnde y cmo usarlos. Las farmacias generalmente imprimen las instrucciones del medicamento slo en las cajas y no directamente en los tubos del medicamento.   Si su medicamento es muy caro, por favor, pngase en contacto con nuestra oficina llamando al [phone] y presione la opcin 4 o envenos un mensaje a travs de MyChart.   No podemos decirle cul ser su copago por los medicamentos por adelantado ya que esto es diferente dependiendo de la cobertura de su seguro. Sin embargo, es posible que podamos encontrar un medicamento sustituto a menor costo o llenar un formulario para que el seguro cubra el medicamento que se considera necesario.   Si se requiere una autorizacin previa para que su compaa de seguros cubra su medicamento, por favor permtanos de 1 a 2 das hbiles para completar este proceso.  Los precios de los medicamentos varan con frecuencia dependiendo del lugar de dnde se surte la receta y alguna farmacias pueden ofrecer precios ms baratos.  El sitio web www.goodrx.com tiene cupones para medicamentos de diferentes farmacias. Los precios aqu no tienen en cuenta lo que podra costar con la ayuda del seguro (puede ser ms barato con su seguro), pero el sitio web puede darle el precio si no utiliz ningn seguro.  - Puede imprimir el cupn correspondiente y llevarlo con su receta a la farmacia.  - Tambin puede pasar por nuestra oficina durante el horario de atencin regular y recoger una tarjeta de cupones de GoodRx.  - Si necesita que su receta se enve electrnicamente a una farmacia diferente, informe a nuestra oficina a travs de MyChart de [facility] o por telfono llamando al [phone] y presione la opcin 4.  
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
## 2022-03-22 ENCOUNTER — Telehealth: Payer: Self-pay

## 2022-03-22 NOTE — Telephone Encounter (Signed)
Advised pt of bx result/sh ?

## 2022-03-22 NOTE — Telephone Encounter (Signed)
-----   Message from Ralene Bathe, MD sent at 03/21/2022  6:12 PM EST ----- Diagnosis Skin , right upper back paraspinal MELANOCYTIC NEVUS, TRAUMATIZED  Benign traumatized mole No further treatment needed

## 2022-04-26 ENCOUNTER — Ambulatory Visit: Payer: 59 | Admitting: Dermatology

## 2022-06-20 ENCOUNTER — Ambulatory Visit: Payer: PRIVATE HEALTH INSURANCE | Admitting: Dermatology

## 2022-07-09 ENCOUNTER — Ambulatory Visit
Admission: EM | Admit: 2022-07-09 | Discharge: 2022-07-09 | Payer: PRIVATE HEALTH INSURANCE | Attending: Family Medicine | Admitting: Family Medicine

## 2022-07-09 DIAGNOSIS — R112 Nausea with vomiting, unspecified: Secondary | ICD-10-CM

## 2022-07-09 DIAGNOSIS — S0990XA Unspecified injury of head, initial encounter: Secondary | ICD-10-CM | POA: Diagnosis not present

## 2022-07-09 DIAGNOSIS — R42 Dizziness and giddiness: Secondary | ICD-10-CM | POA: Diagnosis not present

## 2022-07-09 NOTE — Discharge Instructions (Addendum)
Due to your headache, vomiting, disorientation with memory loss, you will need to be evaluated in the emergency department.    You have been advised to follow up immediately in the emergency department for concerning signs or symptoms as discussed during your visit. If you declined EMS transport, please have a family member take you directly to the ED at this time. Do not delay.   Based on concerns about condition, if you do not follow up in the ED, you may risk poor outcomes including worsening of condition, delayed treatment and potentially life threatening issues. If you have declined to go to the ED at this time, you should call your PCP immediately to set up a follow up appointment.   Go to ED for red flag symptoms, including; fevers you cannot reduce with Tylenol/Motrin, severe headaches, vision changes, numbness/weakness in part of the body, lethargy, confusion, intractable vomiting, severe dehydration, chest pain, breathing difficulty, severe persistent abdominal or pelvic pain, signs of severe infection (increased redness, swelling of an area), feeling faint or passing out, dizziness, etc. You should especially go to the ED for sudden acute worsening of condition if you do not elect to go at this time.
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
## 2022-07-13 ENCOUNTER — Other Ambulatory Visit: Payer: Self-pay | Admitting: Family Medicine

## 2022-07-13 ENCOUNTER — Ambulatory Visit
Admission: RE | Admit: 2022-07-13 | Discharge: 2022-07-13 | Disposition: A | Discharge status: Home / Self Care | Payer: No Typology Code available for payment source | Source: Ambulatory Visit | Attending: Family Medicine | Admitting: Family Medicine

## 2022-07-13 DIAGNOSIS — S060X1A Concussion with loss of consciousness of 30 minutes or less, initial encounter: Secondary | ICD-10-CM | POA: Diagnosis present

## 2022-07-13 DIAGNOSIS — H532 Diplopia: Secondary | ICD-10-CM | POA: Insufficient documentation

## 2022-08-01 DIAGNOSIS — F329 Major depressive disorder, single episode, unspecified: Secondary | ICD-10-CM | POA: Insufficient documentation

## 2022-10-10 ENCOUNTER — Ambulatory Visit (INDEPENDENT_AMBULATORY_CARE_PROVIDER_SITE_OTHER): Payer: PRIVATE HEALTH INSURANCE | Admitting: Dermatology

## 2022-10-10 ENCOUNTER — Encounter: Payer: Self-pay | Admitting: Dermatology

## 2022-10-10 VITALS — BP 139/80 | HR 72

## 2022-10-10 DIAGNOSIS — Z8589 Personal history of malignant neoplasm of other organs and systems: Secondary | ICD-10-CM

## 2022-10-10 DIAGNOSIS — W908XXA Exposure to other nonionizing radiation, initial encounter: Secondary | ICD-10-CM

## 2022-10-10 DIAGNOSIS — Z1283 Encounter for screening for malignant neoplasm of skin: Secondary | ICD-10-CM

## 2022-10-10 DIAGNOSIS — L821 Other seborrheic keratosis: Secondary | ICD-10-CM

## 2022-10-10 DIAGNOSIS — L570 Actinic keratosis: Secondary | ICD-10-CM | POA: Diagnosis not present

## 2022-10-10 DIAGNOSIS — L409 Psoriasis, unspecified: Secondary | ICD-10-CM

## 2022-10-10 DIAGNOSIS — L578 Other skin changes due to chronic exposure to nonionizing radiation: Secondary | ICD-10-CM | POA: Diagnosis not present

## 2022-10-10 DIAGNOSIS — Z79899 Other long term (current) drug therapy: Secondary | ICD-10-CM

## 2022-10-10 DIAGNOSIS — Z85828 Personal history of other malignant neoplasm of skin: Secondary | ICD-10-CM

## 2022-10-10 DIAGNOSIS — Z86018 Personal history of other benign neoplasm: Secondary | ICD-10-CM

## 2022-10-10 DIAGNOSIS — L4050 Arthropathic psoriasis, unspecified: Secondary | ICD-10-CM

## 2022-10-10 DIAGNOSIS — Z7189 Other specified counseling: Secondary | ICD-10-CM

## 2022-10-10 DIAGNOSIS — L814 Other melanin hyperpigmentation: Secondary | ICD-10-CM

## 2022-10-10 MED ORDER — SKYRIZI PEN 150 MG/ML ~~LOC~~ SOAJ: 150.0000 mg | SUBCUTANEOUS | 1 refills | Status: DC

## 2022-10-10 MED ORDER — SKYRIZI PEN 150 MG/ML ~~LOC~~ SOAJ: 150.0000 mg | SUBCUTANEOUS | 4 refills | Status: DC

## 2022-10-10 NOTE — Patient Instructions (Signed)
Cryotherapy Aftercare  Wash gently with soap and water everyday.   Apply Vaseline Jelly daily until healed.    Reviewed risks of biologics including immunosuppression, infections, injection site reaction, and failure to improve condition. Goal is control of skin condition, not cure.  Some older biologics such as Humira and Enbrel may slightly increase risk of malignancy and may worsen congestive heart failure.  Taltz and Cosentyx may cause inflammatory bowel disease to flare. The use of biologics requires long term medication management, including periodic office visits and monitoring of blood work.   Due to recent changes in healthcare laws, you may see results of your pathology and/or laboratory studies on MyChart before the doctors have had a chance to review them. We understand that in some cases there may be results that are confusing or concerning to you. Please understand that not all results are received at the same time and often the doctors may need to interpret multiple results in order to provide you with the best plan of care or course of treatment. Therefore, we ask that you please give [place] 2 business days to thoroughly review all your results before contacting the office for clarification. Should we see a critical lab result, you will be contacted sooner.   If You Need Anything After Your Visit  If you have any questions or concerns for your doctor, please call our main line at [phone] and press option 4 to reach your doctor's medical assistant. If no one answers, please leave a voicemail as directed and we will return your call as soon as possible. Messages left after 4 pm will be answered the following business day.   You may also send [place] a message via MyChart. We typically respond to MyChart messages within 1-2 business days.  For prescription refills, please ask your pharmacy to contact our office. Our fax number is [phone].  If you have an urgent issue when the clinic  is closed that cannot wait until the next business day, you can page your doctor at the number below.    Please note that while we do our best to be available for urgent issues outside of office hours, we are not available 24/7.   If you have an urgent issue and are unable to reach [place], you may choose to seek medical care at your doctor's office, retail clinic, urgent care center, or emergency room.  If you have a medical emergency, please immediately call 911 or go to the emergency department.  Pager Numbers  - Dr. Gwen Pounds: [phone]  - Dr. Roseanne Reno: [phone]  In the event of inclement weather, please call our main line at [phone] for an update on the status of any delays or closures.  Dermatology Medication Tips: Please keep the boxes that topical medications come in in order to help keep track of the instructions about where and how to use these. Pharmacies typically print the medication instructions only on the boxes and not directly on the medication tubes.   If your medication is too expensive, please contact our office at [phone] option 4 or send [place] a message through MyChart.   We are unable to tell what your co-pay for medications will be in advance as this is different depending on your insurance coverage. However, we may be able to find a substitute medication at lower cost or fill out paperwork to get insurance to cover a needed medication.   If a prior authorization is required to get your medication covered by your insurance  company, please allow [place] 1-2 business days to complete this process.  Drug prices often vary depending on where the prescription is filled and some pharmacies may offer cheaper prices.  The website www.goodrx.com contains coupons for medications through different pharmacies. The prices here do not account for what the cost may be with help from insurance (it may be cheaper with your insurance), but the website can give you the price if you  did not use any insurance.  - You can print the associated coupon and take it with your prescription to the pharmacy.  - You may also stop by our office during regular business hours and pick up a GoodRx coupon card.  - If you need your prescription sent electronically to a different pharmacy, notify our office through [facility] or by phone at [phone] option 4.     Si Usted Necesita Algo Despus de Su Visita  Tambin puede enviarnos un mensaje a travs de Clinical cytogeneticist. Por lo general respondemos a los mensajes de MyChart en el transcurso de 1 a 2 das hbiles.  Para renovar recetas, por favor pida a su farmacia que se ponga en contacto con nuestra oficina. Annie Sable de fax es [place] [phone].  Si tiene un asunto urgente cuando la clnica est cerrada y que no puede esperar hasta el siguiente da hbil, puede llamar/localizar a su doctor(a) al nmero que aparece a continuacin.   Por favor, tenga en cuenta que aunque hacemos todo lo posible para estar disponibles para asuntos urgentes fuera del horario de [place], no estamos disponibles las 24 horas del da, los 7 [address]  [address] de la [place].   Si tiene un problema urgente y no puede comunicarse con nosotros, puede optar por buscar atencin mdica  en el consultorio de su doctor(a), en una clnica privada, en un centro de atencin urgente o en una sala de emergencias.  Si tiene Engineer, drilling, por favor llame inmediatamente al 911 o vaya a la sala de emergencias.  Nmeros de bper  - Dr. Gwen Pounds: [phone]  - Dra. Moye: [phone]  - Dra. Roseanne Reno: [phone]  En caso de inclemencias del [place], por favor llame a Lacy Duverney principal al [phone] para una actualizacin sobre el [place] de cualquier retraso o cierre.  Consejos para la medicacin en dermatologa: Por favor, guarde las cajas en las que vienen los medicamentos de uso tpico para ayudarle a seguir las instrucciones sobre dnde y cmo usarlos. Las  farmacias generalmente imprimen las instrucciones del medicamento slo en las cajas y no directamente en los tubos del [place].   Si su medicamento es muy caro, por favor, pngase en contacto con Rolm Gala llamando al [phone] y presione la opcin 4 o envenos un mensaje a travs de Clinical cytogeneticist.   No podemos decirle cul ser su copago por los medicamentos por adelantado ya que esto es diferente dependiendo de la cobertura de su seguro. Sin embargo, es posible que podamos encontrar un medicamento sustituto a Audiological scientist un formulario para que el seguro cubra el medicamento que se considera necesario.   Si se requiere una autorizacin previa para que su compaa de seguros [place] su medicamento, por favor permtanos de 1 a 2 das hbiles para completar [address]  Los precios de los medicamentos varan con frecuencia dependiendo del Environmental consultant de dnde se surte la receta y alguna farmacias pueden ofrecer precios ms baratos.  El sitio web www.goodrx.com tiene cupones para medicamentos de Health and safety inspector. Los precios aqu no tienen en cuenta  lo que podra costar con la ayuda del seguro (puede ser ms barato con su seguro), pero el sitio web puede darle el precio si no Visual merchandiser.  - Puede imprimir el cupn correspondiente y llevarlo con su receta a la farmacia.  - Tambin puede pasar por nuestra oficina durante el horario de atencin regular y Education officer, museum una tarjeta de cupones de GoodRx.  - Si necesita que su receta se enve electrnicamente a una farmacia diferente, informe a nuestra oficina a travs de MyChart de  o por telfono llamando al [phone] y presione la opcin 4.

## 2022-10-10 NOTE — Progress Notes (Signed)
Follow-Up Visit   Subjective  Nicholas Cherry is a 57 y.o. male who presents for the following: Psoriasis. 6 month follow up. Mostly clear today. Left palm and L-3 finger. Cannot remember when had last Stelara injection. States makes too much money to qualify for patient assistance and insurance will not cover any biologic medications. Patient states most affected areas are clear.  The patient presents for Upper Body Skin Exam (UBSE) for skin cancer screening and mole check.  The patient has spots, moles and lesions to be evaluated, some may be new or changing and the patient has concerns that these could be cancer.  The following portions of the chart were reviewed this encounter and updated as appropriate: medications, allergies, medical history  Review of Systems:  No other skin or systemic complaints except as noted in HPI or Assessment and Plan.  Objective  Well appearing patient in no apparent distress; mood and affect are within normal limits.  Areas Examined: All skin waist up examined.  Relevant exam findings are noted in the Assessment and Plan.  right temple x1, mid line forehead x1 (2) Erythematous thin papules/macules with gritty scale.    Assessment & Plan   PSORIASIS with PsA Well-mild scale ar right elbow, 2 cm area. 2, 1 cm patches of psoriasis at left palm and L-3 finger. 1% BSA. Chronic condition with duration or expected duration over one year. Currently well-controlled.  TB test will be due in October if patient goes on a biologic med.  Patient reports joint pain. C/O stiffness in morning.   Treatment Plan: Will submit for Skyrizi starter dose and maintenance dose. Patient assistance forms filled out by patient today. Will send in pending insurance decision.    Counseling on psoriasis and coordination of care  psoriasis is a chronic non-curable, but treatable genetic/hereditary disease that may have other systemic features affecting other organ systems  such as joints (Psoriatic Arthritis). It is associated with an increased risk of inflammatory bowel disease, heart disease, non-alcoholic fatty liver disease, and depression.  Treatments include light and laser treatments; topical medications; and systemic medications including oral and injectables.  SEBORRHEIC KERATOSIS - Stuck-on, waxy, tan-brown papules and/or plaques  - Benign-appearing - Discussed benign etiology and prognosis. - Observe - Call for any changes  HISTORY OF SQUAMOUS CELL CARCINOMA OF THE SKIN - No evidence of recurrence today - No lymphadenopathy - Recommend regular full body skin exams - Recommend daily broad spectrum sunscreen SPF 30+ to sun-exposed areas, reapply every 2 hours as needed.  - Call if any new or changing lesions are noted between office visits  HISTORY OF BASAL CELL CARCINOMA OF THE SKIN - No evidence of recurrence today - Recommend regular full body skin exams - Recommend daily broad spectrum sunscreen SPF 30+ to sun-exposed areas, reapply every 2 hours as needed.  - Call if any new or changing lesions are noted between office visits    HISTORY OF DYSPLASTIC NEVI No evidence of recurrence today Recommend regular full body skin exams Recommend daily broad spectrum sunscreen SPF 30+ to sun-exposed areas, reapply every 2 hours as needed.  Call if any new or changing lesions are noted between office visits   ACTINIC DAMAGE - chronic, secondary to cumulative UV radiation exposure/sun exposure over time - diffuse scaly erythematous macules with underlying dyspigmentation - Recommend daily broad spectrum sunscreen SPF 30+ to sun-exposed areas, reapply every 2 hours as needed.  - Recommend staying in the shade or wearing long sleeves, sun  glasses (UVA+UVB protection) and wide brim hats (4-inch brim around the entire circumference of the hat). - Call for new or changing lesions.  LENTIGINES Exam: scattered tan macules Due to sun exposure Treatment  Plan: Benign-appearing, observe. Recommend daily broad spectrum sunscreen SPF 30+ to sun-exposed areas, reapply every 2 hours as needed.  Call for any changes  Actinic skin damage  Skin cancer screening  Psoriasis  Psoriatic arthritis (HCC)  Seborrheic keratosis  Lentigo  History of dysplastic nevus  History of basal cell carcinoma  History of squamous cell carcinoma  Counseling and coordination of care  Medication management  Long-term use of high-risk medication Return in about 6 months (around 04/12/2023) for Psoriasis Follow Up.  I, Lawson Radar, CMA, am acting as scribe for Armida Sans, MD.  Documentation: I have reviewed the above documentation for accuracy and completeness, and I agree with the above.  Armida Sans, MD

## 2022-10-15 ENCOUNTER — Encounter: Payer: Self-pay | Admitting: Dermatology

## 2023-04-17 ENCOUNTER — Ambulatory Visit: Payer: PRIVATE HEALTH INSURANCE | Admitting: Dermatology

## 2023-05-23 ENCOUNTER — Ambulatory Visit (INDEPENDENT_AMBULATORY_CARE_PROVIDER_SITE_OTHER): Payer: PRIVATE HEALTH INSURANCE | Admitting: Dermatology

## 2023-05-23 ENCOUNTER — Encounter: Payer: Self-pay | Admitting: Dermatology

## 2023-05-23 DIAGNOSIS — L4050 Arthropathic psoriasis, unspecified: Secondary | ICD-10-CM | POA: Diagnosis not present

## 2023-05-23 DIAGNOSIS — Z7189 Other specified counseling: Secondary | ICD-10-CM

## 2023-05-23 DIAGNOSIS — L409 Psoriasis, unspecified: Secondary | ICD-10-CM | POA: Diagnosis not present

## 2023-05-23 DIAGNOSIS — Z79899 Other long term (current) drug therapy: Secondary | ICD-10-CM

## 2023-05-23 DIAGNOSIS — S42001A Fracture of unspecified part of right clavicle, initial encounter for closed fracture: Secondary | ICD-10-CM | POA: Insufficient documentation

## 2023-05-23 DIAGNOSIS — M75100 Unspecified rotator cuff tear or rupture of unspecified shoulder, not specified as traumatic: Secondary | ICD-10-CM | POA: Insufficient documentation

## 2023-05-23 NOTE — Patient Instructions (Signed)

## 2023-05-23 NOTE — Progress Notes (Signed)
   Follow-Up Visit   Subjective  Nicholas Cherry is a 58 y.o. male who presents for the following: psoriasis follow up, not on any current regimen. Pt was on Stelara injections and then retired and has not been able to start on any medications since then due to insurance. 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 Patient flaring at hands, elbows. Reports while on Stelara injections psoriasis cleared.     The patient has spots, moles and lesions to be evaluated, some may be new or changing and the patient may have concern these could be cancer.   The following portions of the chart were reviewed this encounter and updated as appropriate: medications, allergies, medical history  Review of Systems:  No other skin or systemic complaints except as noted in HPI or Assessment and Plan.  Objective  Well appearing patient in no apparent distress; mood and affect are within normal limits.   A focused examination was performed of the following areas: Bil Hands, Bil Elbows  Relevant exam findings are noted in the Assessment and Plan.    Assessment & Plan    PSORIASIS with PsA Exam: scaly fissured plaques on palms elbows. 5% BSA. Joint pains  Chronic and persistent condition with duration or expected duration over one year. Condition is bothersome/symptomatic for patient. Currently flared.   Psoriasis is a chronic non-curable, but treatable genetic/hereditary disease that may have other systemic features affecting other organ systems such as joints (Psoriatic Arthritis). It is associated with an increased risk of inflammatory bowel disease, heart disease, non-alcoholic fatty liver disease, and depression.  Treatments include light and laser treatments; topical medications; and systemic medications including oral and injectables.  Treatment Plan: Pending normal QuantiFERON Gold labs, will send in Stelara injections 45 mg at weeks 0 and 4 then every 12. Labs ordered  today.    PSORIASIS   Related Procedures QuantiFERON-TB Gold Plus PSA (PSORIATIC ARTHRITIS) (HCC)   COUNSELING AND COORDINATION OF CARE   LONG-TERM USE OF HIGH-RISK MEDICATION    No follow-ups on file.  Wynonia Lawman, CMA, am acting as scribe for Elie Goody, MD .   Documentation: I have reviewed the above documentation for accuracy and completeness, and I agree with the above.  Elie Goody, MD

## 2023-05-24 ENCOUNTER — Other Ambulatory Visit: Payer: Self-pay | Admitting: Dermatology

## 2023-05-29 ENCOUNTER — Telehealth: Payer: Self-pay

## 2023-05-29 ENCOUNTER — Other Ambulatory Visit: Payer: Self-pay

## 2023-05-29 ENCOUNTER — Encounter: Payer: Self-pay | Admitting: Dermatology

## 2023-05-29 LAB — QUANTIFERON-TB GOLD PLUS
QuantiFERON Mitogen Value: >10 [IU]/mL
QuantiFERON Nil Value: 0 [IU]/mL
QuantiFERON TB1 Ag Value: 0.01 [IU]/mL
QuantiFERON TB2 Ag Value: 0.01 [IU]/mL
QuantiFERON-TB Gold Plus: NEGATIVE

## 2023-05-29 MED ORDER — STELARA 45 MG/0.5ML ~~LOC~~ SOSY: 45.0000 mg | PREFILLED_SYRINGE | SUBCUTANEOUS | 2 refills | Status: DC

## 2023-05-29 MED ORDER — STELARA 45 MG/0.5ML ~~LOC~~ SOSY: 45.0000 mg | PREFILLED_SYRINGE | SUBCUTANEOUS | 0 refills | Status: DC

## 2023-05-29 NOTE — Telephone Encounter (Signed)
 Advised pt of labs, Stelara sent to [facility]

## 2023-05-29 NOTE — Telephone Encounter (Signed)
-----   Message from [place] sent at 05/29/2023  1:30 PM EDT ----- Please send stelara for psoriasis. 45 mg at weeks 0 and 4 then every 12 weeks. Thank you

## 2023-06-17 ENCOUNTER — Telehealth: Payer: Self-pay

## 2023-06-17 MED ORDER — STELARA 45 MG/0.5ML ~~LOC~~ SOSY: 45.0000 mg | PREFILLED_SYRINGE | SUBCUTANEOUS | 2 refills | Status: AC

## 2023-06-17 MED ORDER — STELARA 45 MG/0.5ML ~~LOC~~ SOSY: 45.0000 mg | PREFILLED_SYRINGE | SUBCUTANEOUS | 0 refills | Status: AC

## 2023-06-17 NOTE — Telephone Encounter (Signed)
 Confirming Syringes to [place]. aw

## 2024-04-16 ENCOUNTER — Encounter: Payer: Self-pay | Admitting: Dermatology

## 2024-04-16 ENCOUNTER — Ambulatory Visit: Payer: PRIVATE HEALTH INSURANCE | Admitting: Dermatology

## 2024-04-16 DIAGNOSIS — L409 Psoriasis, unspecified: Secondary | ICD-10-CM

## 2024-04-16 DIAGNOSIS — D489 Neoplasm of uncertain behavior, unspecified: Secondary | ICD-10-CM

## 2024-04-16 DIAGNOSIS — L820 Inflamed seborrheic keratosis: Secondary | ICD-10-CM

## 2024-04-16 DIAGNOSIS — L814 Other melanin hyperpigmentation: Secondary | ICD-10-CM

## 2024-04-16 DIAGNOSIS — Z79899 Other long term (current) drug therapy: Secondary | ICD-10-CM

## 2024-04-16 DIAGNOSIS — L4050 Arthropathic psoriasis, unspecified: Secondary | ICD-10-CM

## 2024-04-16 DIAGNOSIS — Z1283 Encounter for screening for malignant neoplasm of skin: Secondary | ICD-10-CM

## 2024-04-16 DIAGNOSIS — L578 Other skin changes due to chronic exposure to nonionizing radiation: Secondary | ICD-10-CM

## 2024-04-16 DIAGNOSIS — Z7189 Other specified counseling: Secondary | ICD-10-CM

## 2024-04-16 DIAGNOSIS — Z8589 Personal history of malignant neoplasm of other organs and systems: Secondary | ICD-10-CM

## 2024-04-16 DIAGNOSIS — L570 Actinic keratosis: Secondary | ICD-10-CM

## 2024-04-16 DIAGNOSIS — Z5111 Encounter for antineoplastic chemotherapy: Secondary | ICD-10-CM

## 2024-04-16 NOTE — Progress Notes (Unsigned)
 "  Follow-Up Visit   Subjective  Nicholas Cherry is a 59 y.o. male who presents for the following: Skin Cancer Screening and Full Body Skin Exam Hx of bcc, scc, dysplastic nevi Hx of psoriasis  Flared up today hands and bottoms of feet today  Has used Stelara  in past but insurance will not cover, currently on no treatments Some spots on arms  Some spots at face also at left cheek and left sideburn  The patient presents for Total-Body Skin Exam (TBSE) for skin cancer screening and mole check. The patient has spots, moles and lesions to be evaluated, some may be new or changing and the patient may have concern these could be cancer  The following portions of the chart were reviewed this encounter and updated as appropriate: medications, allergies, medical history  Review of Systems:  No other skin or systemic complaints except as noted in HPI or Assessment and Plan.  Objective  Well appearing patient in no apparent distress; mood and affect are within normal limits.  A full examination was performed including scalp, head, eyes, ears, nose, lips, neck, chest, axillae, abdomen, back, buttocks, bilateral upper extremities, bilateral lower extremities, hands, feet, fingers, toes, fingernails, and toenails. All findings within normal limits unless otherwise noted below.   Relevant physical exam findings are noted in the Assessment and Plan.  Psoriasis photos soles and palms b/l                     face x 7, arms and hands x 10 (17) Erythematous thin papules/macules with gritty scale.  face x 1, arms and hands x 10 (11) Erythematous stuck-on, waxy papule or plaque right proximal dorsum forearm 1.2 cm crusted patch   left middle dorsum forearm 1.3 cm crusted patch    Assessment & Plan   HISTORY OF SQUAMOUS CELL CARCINOMA OF THE SKIN 01/25/2020 left sideburn/ temple - [facility]   02/12/2018 right medial forehead above brow  - No evidence of recurrence today - No  lymphadenopathy - Recommend regular full body skin exams - Recommend daily broad spectrum sunscreen SPF 30+ to sun-exposed areas, reapply every 2 hours as needed.  - Call if any new or changing lesions are noted between office visits  HISTORY OF BASAL CELL CARCINOMA OF THE SKIN 10/11/2015 left lateral infraclavicular  - No evidence of recurrence today - Recommend regular full body skin exams - Recommend daily broad spectrum sunscreen SPF 30+ to sun-exposed areas, reapply every 2 hours as needed.  - Call if any new or changing lesions are noted between office visits  HISTORY OF DYSPLASTIC NEVUS 01/25/2020 right low back - mod  05/24/2021 left infrascapular -severe - excised 07/11/2021 No evidence of recurrence today Recommend regular full body skin exams Recommend daily broad spectrum sunscreen SPF 30+ to sun-exposed areas, reapply every 2 hours as needed.  Call if any new or changing lesions are noted between office visits   SKIN CANCER SCREENING PERFORMED TODAY.   LENTIGINES, SEBORRHEIC KERATOSES, HEMANGIOMAS - Benign normal skin lesions - Benign-appearing - Call for any changes  MELANOCYTIC NEVI - Tan-brown and/or pink-flesh-colored symmetric macules and papules - Benign appearing on exam today - Observation - Call clinic for new or changing moles - Recommend daily use of broad spectrum spf 30+ sunscreen to sun-exposed areas.   DERMATOFIBROMA Exam: Firm pink/brown papulenodule with dimple sign. Right tricep and left tricep Treatment Plan: A dermatofibroma is a benign growth possibly related to trauma, such as an insect bite,  cut from shaving, or inflamed acne-type bump.  Treatment options to remove include shave or excision with resulting scar and risk of recurrence.  Since benign-appearing and not bothersome, will observe for now.    PSORIASIS with PsA Exam: scaly fissured plaques on palms elbows. 5% BSA. Joint pains See photos  No currently on treatment Tried and failed  Otezla and Stelara    Chronic and persistent condition with duration or expected duration over one year. Condition is bothersome/symptomatic for patient. Currently flared.  Psoriasis is a chronic non-curable, but treatable genetic/hereditary disease that may have other systemic features affecting other organ systems such as joints (Psoriatic Arthritis). It is associated with an increased risk of inflammatory bowel disease, heart disease, non-alcoholic fatty liver disease, and depression.  Treatments include light and laser treatments; topical medications; and systemic medications including oral and injectables.  Labs 04/01/2024 reviewed CMP, CBC with Diff, HIV, TSH, all within normal limits - OK  Treatment Plan: Pending normal  QuantiFERON Gold labs and HEP B and HEP C , will send in Tremfya    Discussed if unable to approve Tremfya will consider Skyrizi  or Bimzelx  ACTINIC KERATOSIS (17) face x 7, arms and hands x 10 (17) Start May 24 2024 - Start 5-fluorouracil/calcipotriene cream twice a day for 7 days to affected areas including forehead and temples . Prescription sent to [facility]. Patient advised they will receive an email to purchase the medication online and have it sent to their home. Patient provided with handout reviewing treatment course and side effects and advised to call or message us  on MyChart with any concerns.  Reviewed course of treatment and expected reaction.  Patient advised to expect inflammation and crusting and advised that erosions are possible.  Patient advised to be diligent with sun protection during and after treatment. Counseled to keep medication out of reach of children and pets.   ACTINIC DAMAGE WITH PRECANCEROUS ACTINIC KERATOSES Counseling for Topical Chemotherapy Management: Patient exhibits: - Severe, confluent actinic changes with pre-cancerous actinic keratoses that is secondary to cumulative UV radiation exposure over time -  Condition that is severe; chronic, not at goal. - diffuse scaly erythematous macules and papules with underlying dyspigmentation - Discussed Prescription Field Treatment topical Chemotherapy for Severe, Chronic Confluent Actinic Changes with Pre-Cancerous Actinic Keratoses Field treatment involves treatment of an entire area of skin that has confluent Actinic Changes (Sun/ Ultraviolet light damage) and PreCancerous Actinic Keratoses by method of PhotoDynamic Therapy (PDT) and/or prescription Topical Chemotherapy agents such as 5-fluorouracil, 5-fluorouracil/calcipotriene, and/or imiquimod.  The purpose is to decrease the number of clinically evident and subclinical PreCancerous lesions to prevent progression to development of skin cancer by chemically destroying early precancer changes that may or may not be visible.  It has been shown to reduce the risk of developing skin cancer in the treated area. As a result of treatment, redness, scaling, crusting, and open sores may occur during treatment course. One or more than one of these methods may be used and may have to be used several times to control, suppress and eliminate the PreCancerous changes. Discussed treatment course, expected reaction, and possible side effects. - Recommend daily broad spectrum sunscreen SPF 30+ to sun-exposed areas, reapply every 2 hours as needed.  - Staying in the shade or wearing long sleeves, sun glasses (UVA+UVB protection) and wide brim hats (4-inch brim around the entire circumference of the hat) are also recommended. - Call for new or changing lesions.  Actinic keratoses are precancerous spots  that appear secondary to cumulative UV radiation exposure/sun exposure over time. They are chronic with expected duration over 1 year. A portion of actinic keratoses will progress to squamous cell carcinoma of the skin. It is not possible to reliably predict which spots will progress to skin cancer and so treatment is recommended to  prevent development of skin cancer.  Recommend daily broad spectrum sunscreen SPF 30+ to sun-exposed areas, reapply every 2 hours as needed.  Recommend staying in the shade or wearing long sleeves, sun glasses (UVA+UVB protection) and wide brim hats (4-inch brim around the entire circumference of the hat). Call for new or changing lesions. INFLAMED SEBORRHEIC KERATOSIS (11) face x 1, arms and hands x 10 (11) Symptomatic, irritating, patient would like treated. - Destruction of lesion - face x 1, arms and hands x 10 (11) Complexity: simple   Destruction method: cryotherapy   Informed consent: discussed and consent obtained   Timeout:  patient name, date of birth, surgical site, and procedure verified Lesion destroyed using liquid nitrogen: Yes   Region frozen until ice ball extended beyond lesion: Yes   Outcome: patient tolerated procedure well with no complications   Post-procedure details: wound care instructions given    NEOPLASM OF UNCERTAIN BEHAVIOR (2) right proximal dorsum forearm - Epidermal / dermal shaving  Lesion diameter (cm):  1.2 Informed consent: discussed and consent obtained   Timeout: patient name, date of birth, surgical site, and procedure verified   Procedure prep:  Patient was prepped and draped in usual sterile fashion Prep type:  Isopropyl alcohol Anesthesia: the lesion was anesthetized in a standard fashion   Anesthetic:  1% lidocaine w/ epinephrine 1-100,000 buffered w/ 8.4% NaHCO3 Instrument used: flexible razor blade   Hemostasis achieved with: pressure, aluminum chloride and electrodesiccation   Outcome: patient tolerated procedure well   Post-procedure details: sterile dressing applied and wound care instructions given   Dressing type: bandage and petrolatum    - Destruction of lesion Complexity: extensive   Destruction method: electrodesiccation and curettage   Informed consent: discussed and consent obtained   Timeout:  patient name, date of  birth, surgical site, and procedure verified Procedure prep:  Patient was prepped and draped in usual sterile fashion Prep type:  Isopropyl alcohol Anesthesia: the lesion was anesthetized in a standard fashion   Anesthetic:  1% lidocaine w/ epinephrine 1-100,000 buffered w/ 8.4% NaHCO3 Curettage performed in three different directions: Yes   Electrodesiccation performed over the curetted area: Yes   Lesion length (cm):  1.2 Lesion width (cm):  1.2 Margin per side (cm):  0.2 Final wound size (cm):  1.6 Hemostasis achieved with:  pressure, aluminum chloride and electrodesiccation Outcome: patient tolerated procedure well with no complications   Post-procedure details: sterile dressing applied and wound care instructions given   Dressing type: bandage and petrolatum    Specimen 1 - Surgical pathology Differential Diagnosis: r/o scc ED&C today  Check Margins: No left middle dorsum forearm - Epidermal / dermal shaving  Lesion diameter (cm):  1.3 Informed consent: discussed and consent obtained   Timeout: patient name, date of birth, surgical site, and procedure verified   Procedure prep:  Patient was prepped and draped in usual sterile fashion Prep type:  Isopropyl alcohol Anesthesia: the lesion was anesthetized in a standard fashion   Anesthetic:  1% lidocaine w/ epinephrine 1-100,000 buffered w/ 8.4% NaHCO3 Instrument used: flexible razor blade   Hemostasis achieved with: pressure, aluminum chloride and electrodesiccation   Outcome: patient tolerated procedure well  Post-procedure details: sterile dressing applied and wound care instructions given   Dressing type: bandage and petrolatum    - Destruction of lesion Complexity: extensive   Destruction method: electrodesiccation and curettage   Informed consent: discussed and consent obtained   Timeout:  patient name, date of birth, surgical site, and procedure verified Procedure prep:  Patient was prepped and draped in usual  sterile fashion Prep type:  Isopropyl alcohol Anesthesia: the lesion was anesthetized in a standard fashion   Anesthetic:  1% lidocaine w/ epinephrine 1-100,000 buffered w/ 8.4% NaHCO3 Curettage performed in three different directions: Yes   Electrodesiccation performed over the curetted area: Yes   Lesion length (cm):  1.3 Lesion width (cm):  1.3 Margin per side (cm):  0.2 Final wound size (cm):  1.7 Hemostasis achieved with:  pressure, aluminum chloride and electrodesiccation Outcome: patient tolerated procedure well with no complications   Post-procedure details: sterile dressing applied and wound care instructions given   Dressing type: bandage and petrolatum    Specimen 2 - Surgical pathology Differential Diagnosis: r/o scc  ED&C today  Check Margins: no Shv and edc   R/o scc  LONG-TERM USE OF HIGH-RISK MEDICATION   This Visit - Hepatitis B surface antibody,qualitative - Hepatitis B surface antigen - Hepatitis C antibody - QuantiFERON-TB Gold Plus SKIN CANCER SCREENING   ACTINIC SKIN DAMAGE   HISTORY OF SQUAMOUS CELL CARCINOMA   LENTIGO   PSORIASIS   PSORIATIC ARTHRITIS (HCC)   CHEMOTHERAPY MANAGEMENT, ENCOUNTER FOR   COUNSELING AND COORDINATION OF CARE   MEDICATION MANAGEMENT   Greater than 45 minutes (48 minutes) spent with patient in evaluation and treatment and counseling today.     Return for 6 month psoriasis follow up / tbse hx of bcc, scc, dysplastic .  IEleanor Blush, CMA, am acting as scribe for Alm Rhyme, MD.   Documentation: I have reviewed the above documentation for accuracy and completeness, and I agree with the above.  Alm Rhyme, MD    "

## 2024-04-16 NOTE — Patient Instructions (Addendum)
 " Biopsy Wound Care Instructions  Leave the original bandage on for 24 hours if possible.  If the bandage becomes soaked or soiled before that time, it is OK to remove it and examine the wound.  A small amount of post-operative bleeding is normal.  If excessive bleeding occurs, remove the bandage, place gauze over the site and apply continuous pressure (no peeking) over the area for 30 minutes. If this does not work, please call our clinic as soon as possible or page your doctor if it is after hours.   Once a day, cleanse the wound with soap and water. It is fine to shower. If a thick crust develops you may use a Q-tip dipped into dilute hydrogen peroxide (mix 1:1 with water) to dissolve it.  Hydrogen peroxide can slow the healing process, so use it only as needed.    After washing, apply petroleum jelly (Vaseline) or an antibiotic ointment if your doctor prescribed one for you, followed by a bandage.    For best healing, the wound should be covered with a layer of ointment at all times. If you are not able to keep the area covered with a bandage to hold the ointment in place, this may mean re-applying the ointment several times a day.  Continue this wound care until the wound has healed and is no longer open.   Itching and mild discomfort is normal during the healing process. However, if you develop pain or severe itching, please call our office.   If you have any discomfort, you can take Tylenol  (acetaminophen ) or ibuprofen as directed on the bottle. (Please do not take these if you have an allergy to them or cannot take them for another reason).  Some redness, tenderness and white or yellow material in the wound is normal healing.  If the area becomes very sore and red, or develops a thick yellow-green material (pus), it may be infected; please notify us .    If you have stitches, return to clinic as directed to have the stitches removed. You will continue wound care for 2-3 days after the stitches  are removed.   Wound healing continues for up to one year following surgery. It is not unusual to experience pain in the scar from time to time during the interval.  If the pain becomes severe or the scar thickens, you should notify the office.    A slight amount of redness in a scar is expected for the first six months.  After six months, the redness will fade and the scar will soften and fade.  The color difference becomes less noticeable with time.  If there are any problems, return for a post-op surgery check at your earliest convenience.  To improve the appearance of the scar, you can use silicone scar gel, cream, or sheets (such as Mederma or Serica) every night for up to one year. These are available over the counter (without a prescription).  Please call our office at [phone] for any questions or concerns.    Electrodesiccation and Curettage (Scrape and Burn) Wound Care Instructions  Leave the original bandage on for 24 hours if possible.  If the bandage becomes soaked or soiled before that time, it is OK to remove it and examine the wound.  A small amount of post-operative bleeding is normal.  If excessive bleeding occurs, remove the bandage, place gauze over the site and apply continuous pressure (no peeking) over the area for 30 minutes. If this does not work, please  call our clinic as soon as possible or page your doctor if it is after hours.   Once a day, cleanse the wound with soap and water. It is fine to shower. If a thick crust develops you may use a Q-tip dipped into dilute hydrogen peroxide (mix 1:1 with water) to dissolve it.  Hydrogen peroxide can slow the healing process, so use it only as needed.    After washing, apply petroleum jelly (Vaseline) or an antibiotic ointment if your doctor prescribed one for you, followed by a bandage.    For best healing, the wound should be covered with a layer of ointment at all times. If you are not able to keep the area covered  with a bandage to hold the ointment in place, this may mean re-applying the ointment several times a day.  Continue this wound care until the wound has healed and is no longer open. It may take several weeks for the wound to heal and close.  Itching and mild discomfort is normal during the healing process.  If you have any discomfort, you can take Tylenol  (acetaminophen ) or ibuprofen as directed on the bottle. (Please do not take these if you have an allergy to them or cannot take them for another reason).  Some redness, tenderness and white or yellow material in the wound is normal healing.  If the area becomes very sore and red, or develops a thick yellow-green material (pus), it may be infected; please notify us .    Wound healing continues for up to one year following surgery. It is not unusual to experience pain in the scar from time to time during the interval.  If the pain becomes severe or the scar thickens, you should notify the office.    A slight amount of redness in a scar is expected for the first six months.  After six months, the redness will fade and the scar will soften and fade.  The color difference becomes less noticeable with time.  If there are any problems, return for a post-op surgery check at your earliest convenience.  To improve the appearance of the scar, you can use silicone scar gel, cream, or sheets (such as Mederma or Serica) every night for up to one year. These are available over the counter (without a prescription).  Please call our office at [phone] for any questions or concerns.    Start cream May 24 2024 - Start 5-fluorouracil/calcipotriene cream twice a day for 7 days to affected areas including forehead and temples . Prescription sent to [facility]. Patient advised they will receive an email to purchase the medication online and have it sent to their home. Patient provided with handout reviewing treatment course and side  effects and advised to call or message us  on MyChart with any concerns.  Reviewed course of treatment and expected reaction.  Patient advised to expect inflammation and crusting and advised that erosions are possible.  Patient advised to be diligent with sun protection during and after treatment. Counseled to keep medication out of reach of children and pets.  Instructions for [facility]  One or more of your medications was sent to the [facility]. You will receive an email from them and can purchase the medicine through that link. It will then be mailed to your home at the address you confirmed. If for any reason you do not receive an email from them, please check your spam folder. If you  still do not find the email, please let us  know. [facility] phone number is [phone].     5-Fluorouracil/Calcipotriene Patient Education   Actinic keratoses are the dry, red scaly spots on the skin caused by sun damage. A portion of these spots can turn into skin cancer with time, and treating them can help prevent development of skin cancer.   Treatment of these spots requires removal of the defective skin cells. There are various ways to remove actinic keratoses, including freezing with liquid nitrogen, treatment with creams, or treatment with a blue light procedure in the office.   5-fluorouracil cream is a topical cream used to treat actinic keratoses. It works by interfering with the growth of abnormal fast-growing skin cells, such as actinic keratoses. These cells peel off and are replaced by healthy ones.   5-fluorouracil/calcipotriene is a combination of the 5-fluorouracil cream with a vitamin D analog cream called calcipotriene. The calcipotriene alone does not treat actinic keratoses. However, when it is combined with 5-fluorouracil, it helps the 5-fluorouracil treat the actinic keratoses much faster so that the same results can be achieved  with a much shorter treatment time.  INSTRUCTIONS FOR 5-FLUOROURACIL/CALCIPOTRIENE CREAM:   5-fluorouracil/calcipotriene cream typically only needs to be used for 4-7 days. A thin layer should be applied twice a day to the treatment areas recommended by your physician.   If your physician prescribed you separate tubes of 5-fluourouracil and calcipotriene, apply a thin layer of 5-fluorouracil followed by a thin layer of calcipotriene.   Avoid contact with your eyes, nostrils, and mouth. Do not use 5-fluorouracil/calcipotriene cream on infected or open wounds.   You will develop redness, irritation and some crusting at areas where you have pre-cancer damage/actinic keratoses. IF YOU DEVELOP PAIN, BLEEDING, OR SIGNIFICANT CRUSTING, STOP THE TREATMENT EARLY - you have already gotten a good response and the actinic keratoses should clear up well.  Wash your hands after applying 5-fluorouracil 5% cream on your skin.   A moisturizer or sunscreen with a minimum SPF 30 should be applied each morning.   Once you have finished the treatment, you can apply a thin layer of Vaseline twice a day to irritated areas to soothe and calm the areas more quickly. If you experience significant discomfort, contact your physician.  For some patients it is necessary to repeat the treatment for best results.  SIDE EFFECTS: When using 5-fluorouracil/calcipotriene cream, you may have mild irritation, such as redness, dryness, swelling, or a mild burning sensation. This usually resolves within 2 weeks. The more actinic keratoses you have, the more redness and inflammation you can expect during treatment. Eye irritation has been reported rarely. If this occurs, please let us  know.  If you have any trouble using this cream, please call the office. If you have any other questions about this information, please do not hesitate to ask me before you leave the office.   Melanoma ABCDEs  Melanoma is the most dangerous type of  skin cancer, and is the leading cause of death from skin disease.  You are more likely to develop melanoma if you: Have light-colored skin, light-colored eyes, or red or blond hair Spend a lot of time in the sun Tan regularly, either outdoors or in a tanning bed Have had blistering sunburns, especially during childhood Have a close family member who has had a melanoma Have atypical moles or large birthmarks  Early detection of melanoma is key since treatment is typically straightforward and cure rates are extremely high if  we catch it early.   The first sign of melanoma is often a change in a mole or a new dark spot.  The ABCDE system is a way of remembering the signs of melanoma.  A for asymmetry:  The two halves do not match. B for border:  The edges of the growth are irregular. C for color:  A mixture of colors are present instead of an even brown color. D for diameter:  Melanomas are usually (but not always) greater than 6mm - the size of a pencil eraser. E for evolution:  The spot keeps changing in size, shape, and color.  Please check your skin once per month between visits. You can use a small mirror in front and a large mirror behind you to keep an eye on the back side or your body.   If you see any new or changing lesions before your next follow-up, please call to schedule a visit.  Please continue daily skin protection including broad spectrum sunscreen SPF 30+ to sun-exposed areas, reapplying every 2 hours as needed when you're outdoors.   Staying in the shade or wearing long sleeves, sun glasses (UVA+UVB protection) and wide brim hats (4-inch brim around the entire circumference of the hat) are also recommended for sun protection.    Due to recent changes in healthcare laws, you may see results of your pathology and/or laboratory studies on MyChart before the doctors have had a chance to review them. We understand that in some cases there may be results that are confusing or  concerning to you. Please understand that not all results are received at the same time and often the doctors may need to interpret multiple results in order to provide you with the best plan of care or course of treatment. Therefore, we ask that you please give us  2 business days to thoroughly review all your results before contacting the office for clarification. Should we see a critical lab result, you will be contacted sooner.   If You Need Anything After Your Visit  If you have any questions or concerns for your doctor, please call our main line at [phone] and press option 4 to reach your doctor's medical assistant. If no one answers, please leave a voicemail as directed and we will return your call as soon as possible. Messages left after 4 pm will be answered the following business day.   You may also send us  a message via MyChart. We typically respond to MyChart messages within 1-2 business days.  For prescription refills, please ask your pharmacy to contact our office. Our fax number is [phone].  If you have an urgent issue when the clinic is closed that cannot wait until the next business day, you can page your doctor at the number below.    Please note that while we do our best to be available for urgent issues outside of office hours, we are not available 24/7.   If you have an urgent issue and are unable to reach us , you may choose to seek medical care at your doctor's office, retail clinic, urgent care center, or emergency room.  If you have a medical emergency, please immediately call 911 or go to the emergency department.  Pager Numbers  - Dr. Hester: [phone]  - Dr. Jackquline: [phone]  - Dr. Claudene: [phone]   - Dr. Raymund: [phone]  In the event of inclement weather, please call our main line at [phone] for an update on the status of any delays  or closures.  Dermatology Medication Tips: Please keep the boxes that topical medications come  in in order to help keep track of the instructions about where and how to use these. Pharmacies typically print the medication instructions only on the boxes and not directly on the medication tubes.   If your medication is too expensive, please contact our office at [phone] option 4 or send us  a message through MyChart.   We are unable to tell what your co-pay for medications will be in advance as this is different depending on your insurance coverage. However, we may be able to find a substitute medication at lower cost or fill out paperwork to get insurance to cover a needed medication.   If a prior authorization is required to get your medication covered by your insurance company, please allow us  1-2 business days to complete this process.  Drug prices often vary depending on where the prescription is filled and some pharmacies may offer cheaper prices.  The website www.goodrx.com contains coupons for medications through different pharmacies. The prices here do not account for what the cost may be with help from insurance (it may be cheaper with your insurance), but the website can give you the price if you did not use any insurance.  - You can print the associated coupon and take it with your prescription to the pharmacy.  - You may also stop by our office during regular business hours and pick up a GoodRx coupon card.  - If you need your prescription sent electronically to a different pharmacy, notify our office through [facility] or by phone at [phone] option 4.     Si Usted Necesita Algo Despus de Su Visita  Tambin puede enviarnos un mensaje a travs de Clinical Cytogeneticist. Por lo general respondemos a los mensajes de MyChart en el transcurso de 1 a 2 das hbiles.  Para renovar recetas, por favor pida a su farmacia que se ponga en contacto con nuestra oficina. Randi lakes de fax es [place] [phone].  Si tiene un asunto urgente cuando la clnica est cerrada y que no puede  esperar hasta el siguiente da hbil, puede llamar/localizar a su doctor(a) al nmero que aparece a continuacin.   Por favor, tenga en cuenta que aunque hacemos todo lo posible para estar disponibles para asuntos urgentes fuera del horario de [place], no estamos disponibles las 24 horas del da, los 7 [address]  [address] de la [place].   Si tiene un problema urgente y no puede comunicarse con nosotros, puede optar por buscar atencin mdica  en el consultorio de su doctor(a), en una clnica privada, en un centro de atencin urgente o en una sala de emergencias.  Si tiene engineer, drilling, por favor llame inmediatamente al 911 o vaya a la sala de emergencias.  Nmeros de bper  - Dr. Hester: [phone]  - Dra. Jackquline: [phone]  - Dr. Claudene: [phone]  - Dra. Kitts: [phone]  En caso de inclemencias del [place], por favor llame a nuestra lnea principal al [phone] para una actualizacin sobre el estado de cualquier retraso o cierre.  Consejos para la medicacin en dermatologa: Por favor, guarde las cajas en las que vienen los medicamentos de uso tpico para ayudarle a seguir las instrucciones sobre dnde y cmo usarlos. Las farmacias generalmente imprimen las instrucciones del medicamento slo en las cajas y no directamente en los tubos del [place].   Si su medicamento es muy caro, por favor, pngase en contacto con nuestra oficina llamando al [phone]  y presione la opcin 4 o envenos un mensaje a travs de Clinical Cytogeneticist.   No podemos decirle cul ser su copago por los medicamentos por adelantado ya que esto es diferente dependiendo de la cobertura de su seguro. Sin embargo, es posible que podamos encontrar un medicamento sustituto a audiological scientist un formulario para que el seguro cubra el medicamento que se considera necesario.   Si se requiere una autorizacin previa para que su compaa de seguros cubra su medicamento, por favor permtanos de 1 a 2 das hbiles para  completar este proceso.  Los precios de los medicamentos varan con frecuencia dependiendo del environmental consultant de dnde se surte la receta y alguna farmacias pueden ofrecer precios ms baratos.  El sitio web www.goodrx.com tiene cupones para medicamentos de health and safety inspector. Los precios aqu no tienen en cuenta lo que podra costar con la ayuda del seguro (puede ser ms barato con su seguro), pero el sitio web puede darle el precio si no utiliz tourist information centre manager.  - Puede imprimir el cupn correspondiente y llevarlo con su receta a la farmacia.  - Tambin puede pasar por nuestra oficina durante el horario de atencin regular y education officer, museum una tarjeta de cupones de GoodRx.  - Si necesita que su receta se enve electrnicamente a una farmacia diferente, informe a nuestra oficina a travs de MyChart de [place] o por telfono llamando al [phone] y presione la opcin 4.  "

## 2024-04-17 LAB — SURGICAL PATHOLOGY

## 2024-10-19 ENCOUNTER — Ambulatory Visit: Payer: PRIVATE HEALTH INSURANCE | Admitting: Dermatology
